# Patient Record
Sex: Female | Born: 1955
Health system: Southern US, Community
[De-identification: ages and names within clinical notes are randomized; demographics above are authoritative.]

## PROBLEM LIST (undated history)

## (undated) DIAGNOSIS — D841 Defects in the complement system: Secondary | ICD-10-CM

## (undated) DIAGNOSIS — H269 Unspecified cataract: Secondary | ICD-10-CM

## (undated) DIAGNOSIS — D649 Anemia, unspecified: Secondary | ICD-10-CM

## (undated) DIAGNOSIS — Z8719 Personal history of other diseases of the digestive system: Secondary | ICD-10-CM

## (undated) DIAGNOSIS — L509 Urticaria, unspecified: Secondary | ICD-10-CM

## (undated) DIAGNOSIS — M8580 Other specified disorders of bone density and structure, unspecified site: Secondary | ICD-10-CM

## (undated) DIAGNOSIS — K219 Gastro-esophageal reflux disease without esophagitis: Secondary | ICD-10-CM

## (undated) DIAGNOSIS — T7840XA Allergy, unspecified, initial encounter: Secondary | ICD-10-CM

## (undated) HISTORY — DX: Anemia, unspecified: D64.9

## (undated) HISTORY — DX: Defects in the complement system: D84.1

## (undated) HISTORY — DX: Personal history of other diseases of the digestive system: Z87.19

## (undated) HISTORY — DX: Unspecified cataract: H26.9

## (undated) HISTORY — DX: Allergy, unspecified, initial encounter: T78.40XA

## (undated) HISTORY — DX: Gastro-esophageal reflux disease without esophagitis: K21.9

## (undated) HISTORY — PX: EXPLORATORY LAPAROTOMY: SUR591

## (undated) HISTORY — DX: Urticaria, unspecified: L50.9

## (undated) HISTORY — DX: Other specified disorders of bone density and structure, unspecified site: M85.80

## (undated) HISTORY — PX: UPPER GASTROINTESTINAL ENDOSCOPY: SHX188

---

## 1998-08-21 ENCOUNTER — Ambulatory Visit (HOSPITAL_COMMUNITY): Admission: RE | Admit: 1998-08-21 | Discharge: 1998-08-21 | Payer: Self-pay | Admitting: Internal Medicine

## 1998-08-21 ENCOUNTER — Encounter: Payer: Self-pay | Admitting: Internal Medicine

## 1998-09-21 ENCOUNTER — Ambulatory Visit (HOSPITAL_COMMUNITY): Admission: RE | Admit: 1998-09-21 | Discharge: 1998-09-21 | Payer: Self-pay | Admitting: Internal Medicine

## 1998-09-21 ENCOUNTER — Encounter: Payer: Self-pay | Admitting: Internal Medicine

## 1998-12-24 ENCOUNTER — Other Ambulatory Visit: Admission: RE | Admit: 1998-12-24 | Discharge: 1998-12-24 | Payer: Self-pay | Admitting: Gynecology

## 1998-12-29 ENCOUNTER — Ambulatory Visit (HOSPITAL_COMMUNITY): Admission: RE | Admit: 1998-12-29 | Discharge: 1998-12-29 | Payer: Self-pay | Admitting: Internal Medicine

## 1999-01-15 ENCOUNTER — Inpatient Hospital Stay (HOSPITAL_COMMUNITY): Admission: EM | Admit: 1999-01-15 | Discharge: 1999-01-16 | Payer: Self-pay | Admitting: Emergency Medicine

## 1999-01-15 ENCOUNTER — Encounter: Payer: Self-pay | Admitting: Internal Medicine

## 1999-01-16 ENCOUNTER — Encounter: Payer: Self-pay | Admitting: Internal Medicine

## 1999-01-17 ENCOUNTER — Inpatient Hospital Stay (HOSPITAL_COMMUNITY): Admission: AD | Admit: 1999-01-17 | Discharge: 1999-01-20 | Payer: Self-pay | Admitting: Internal Medicine

## 1999-01-17 ENCOUNTER — Encounter: Payer: Self-pay | Admitting: Internal Medicine

## 1999-01-18 ENCOUNTER — Encounter: Payer: Self-pay | Admitting: Internal Medicine

## 1999-01-19 ENCOUNTER — Encounter: Payer: Self-pay | Admitting: Internal Medicine

## 1999-01-20 ENCOUNTER — Encounter: Payer: Self-pay | Admitting: Internal Medicine

## 1999-01-25 ENCOUNTER — Encounter: Payer: Self-pay | Admitting: Internal Medicine

## 1999-01-25 ENCOUNTER — Ambulatory Visit (HOSPITAL_COMMUNITY): Admission: RE | Admit: 1999-01-25 | Discharge: 1999-01-25 | Payer: Self-pay | Admitting: Internal Medicine

## 1999-03-06 ENCOUNTER — Inpatient Hospital Stay (HOSPITAL_COMMUNITY): Admission: RE | Admit: 1999-03-06 | Discharge: 1999-03-08 | Payer: Self-pay | Admitting: Surgery

## 1999-05-11 ENCOUNTER — Encounter: Admission: RE | Admit: 1999-05-11 | Discharge: 1999-05-11 | Payer: Self-pay | Admitting: Internal Medicine

## 1999-05-11 ENCOUNTER — Encounter: Payer: Self-pay | Admitting: Internal Medicine

## 1999-12-30 ENCOUNTER — Other Ambulatory Visit: Admission: RE | Admit: 1999-12-30 | Discharge: 1999-12-30 | Payer: Self-pay | Admitting: Obstetrics and Gynecology

## 2000-01-12 ENCOUNTER — Encounter: Payer: Self-pay | Admitting: Internal Medicine

## 2000-01-12 ENCOUNTER — Ambulatory Visit (HOSPITAL_COMMUNITY): Admission: RE | Admit: 2000-01-12 | Discharge: 2000-01-12 | Payer: Self-pay | Admitting: Internal Medicine

## 2000-04-06 ENCOUNTER — Other Ambulatory Visit: Admission: RE | Admit: 2000-04-06 | Discharge: 2000-04-06 | Payer: Self-pay | Admitting: Obstetrics and Gynecology

## 2000-04-24 ENCOUNTER — Encounter (INDEPENDENT_AMBULATORY_CARE_PROVIDER_SITE_OTHER): Payer: Self-pay | Admitting: Specialist

## 2000-04-24 ENCOUNTER — Other Ambulatory Visit: Admission: RE | Admit: 2000-04-24 | Discharge: 2000-04-24 | Payer: Self-pay | Admitting: Obstetrics and Gynecology

## 2000-06-07 ENCOUNTER — Encounter: Admission: RE | Admit: 2000-06-07 | Discharge: 2000-06-07 | Payer: Self-pay | Admitting: Internal Medicine

## 2000-06-07 ENCOUNTER — Encounter: Payer: Self-pay | Admitting: Internal Medicine

## 2000-07-19 ENCOUNTER — Other Ambulatory Visit: Admission: RE | Admit: 2000-07-19 | Discharge: 2000-07-19 | Payer: Self-pay | Admitting: Obstetrics and Gynecology

## 2000-07-19 ENCOUNTER — Encounter (INDEPENDENT_AMBULATORY_CARE_PROVIDER_SITE_OTHER): Payer: Self-pay

## 2001-06-08 ENCOUNTER — Encounter: Payer: Self-pay | Admitting: Internal Medicine

## 2001-06-08 ENCOUNTER — Encounter: Admission: RE | Admit: 2001-06-08 | Discharge: 2001-06-08 | Payer: Self-pay | Admitting: Gastroenterology

## 2002-06-10 ENCOUNTER — Encounter: Payer: Self-pay | Admitting: Internal Medicine

## 2002-06-10 ENCOUNTER — Encounter: Admission: RE | Admit: 2002-06-10 | Discharge: 2002-06-10 | Payer: Self-pay | Admitting: Internal Medicine

## 2002-09-20 ENCOUNTER — Encounter: Admission: RE | Admit: 2002-09-20 | Discharge: 2002-09-20 | Payer: Self-pay | Admitting: Sports Medicine

## 2002-11-13 ENCOUNTER — Ambulatory Visit (HOSPITAL_COMMUNITY): Admission: EM | Admit: 2002-11-13 | Discharge: 2002-11-14 | Payer: Self-pay | Admitting: Internal Medicine

## 2002-11-14 ENCOUNTER — Encounter: Payer: Self-pay | Admitting: Internal Medicine

## 2002-12-24 ENCOUNTER — Ambulatory Visit (HOSPITAL_COMMUNITY): Admission: RE | Admit: 2002-12-24 | Discharge: 2002-12-24 | Payer: Self-pay | Admitting: Internal Medicine

## 2002-12-24 ENCOUNTER — Encounter: Payer: Self-pay | Admitting: Internal Medicine

## 2003-06-17 ENCOUNTER — Encounter: Admission: RE | Admit: 2003-06-17 | Discharge: 2003-06-17 | Payer: Self-pay | Admitting: Internal Medicine

## 2004-04-26 ENCOUNTER — Other Ambulatory Visit: Admission: RE | Admit: 2004-04-26 | Discharge: 2004-04-26 | Payer: Self-pay | Admitting: Obstetrics and Gynecology

## 2004-06-28 ENCOUNTER — Encounter: Admission: RE | Admit: 2004-06-28 | Discharge: 2004-06-28 | Payer: Self-pay | Admitting: Internal Medicine

## 2004-07-09 ENCOUNTER — Encounter: Admission: RE | Admit: 2004-07-09 | Discharge: 2004-07-09 | Payer: Self-pay

## 2004-10-29 ENCOUNTER — Other Ambulatory Visit: Admission: RE | Admit: 2004-10-29 | Discharge: 2004-10-29 | Payer: Self-pay | Admitting: Obstetrics and Gynecology

## 2005-04-28 ENCOUNTER — Other Ambulatory Visit: Admission: RE | Admit: 2005-04-28 | Discharge: 2005-04-28 | Payer: Self-pay | Admitting: Obstetrics and Gynecology

## 2005-06-20 ENCOUNTER — Encounter: Admission: RE | Admit: 2005-06-20 | Discharge: 2005-06-20 | Payer: Self-pay | Admitting: Internal Medicine

## 2006-04-19 ENCOUNTER — Other Ambulatory Visit: Admission: RE | Admit: 2006-04-19 | Discharge: 2006-04-19 | Payer: Self-pay | Admitting: Obstetrics and Gynecology

## 2006-06-29 ENCOUNTER — Encounter: Admission: RE | Admit: 2006-06-29 | Discharge: 2006-06-29 | Payer: Self-pay | Admitting: Obstetrics and Gynecology

## 2006-11-24 ENCOUNTER — Ambulatory Visit: Payer: Self-pay | Admitting: Sports Medicine

## 2006-11-24 DIAGNOSIS — M79609 Pain in unspecified limb: Secondary | ICD-10-CM | POA: Insufficient documentation

## 2006-11-24 DIAGNOSIS — G8929 Other chronic pain: Secondary | ICD-10-CM | POA: Insufficient documentation

## 2007-02-09 ENCOUNTER — Ambulatory Visit: Payer: Self-pay | Admitting: Sports Medicine

## 2007-02-09 DIAGNOSIS — M722 Plantar fascial fibromatosis: Secondary | ICD-10-CM | POA: Insufficient documentation

## 2007-02-09 DIAGNOSIS — M2170 Unequal limb length (acquired), unspecified site: Secondary | ICD-10-CM | POA: Insufficient documentation

## 2007-04-25 ENCOUNTER — Other Ambulatory Visit: Admission: RE | Admit: 2007-04-25 | Discharge: 2007-04-25 | Payer: Self-pay | Admitting: Obstetrics and Gynecology

## 2007-04-27 ENCOUNTER — Encounter: Admission: RE | Admit: 2007-04-27 | Discharge: 2007-04-27 | Payer: Self-pay | Admitting: Obstetrics and Gynecology

## 2007-05-31 HISTORY — PX: COLONOSCOPY: SHX174

## 2007-06-30 ENCOUNTER — Ambulatory Visit: Payer: Self-pay | Admitting: Family Medicine

## 2007-08-10 ENCOUNTER — Ambulatory Visit: Payer: Self-pay | Admitting: Sports Medicine

## 2007-08-10 DIAGNOSIS — M2020 Hallux rigidus, unspecified foot: Secondary | ICD-10-CM | POA: Insufficient documentation

## 2007-08-10 DIAGNOSIS — M2140 Flat foot [pes planus] (acquired), unspecified foot: Secondary | ICD-10-CM | POA: Insufficient documentation

## 2007-09-21 ENCOUNTER — Ambulatory Visit: Payer: Self-pay | Admitting: Internal Medicine

## 2007-09-21 DIAGNOSIS — F430 Acute stress reaction: Secondary | ICD-10-CM | POA: Insufficient documentation

## 2007-09-21 DIAGNOSIS — R109 Unspecified abdominal pain: Secondary | ICD-10-CM | POA: Insufficient documentation

## 2007-09-23 DIAGNOSIS — K219 Gastro-esophageal reflux disease without esophagitis: Secondary | ICD-10-CM | POA: Insufficient documentation

## 2007-09-24 ENCOUNTER — Telehealth (INDEPENDENT_AMBULATORY_CARE_PROVIDER_SITE_OTHER): Payer: Self-pay | Admitting: *Deleted

## 2007-11-10 ENCOUNTER — Encounter: Payer: Self-pay | Admitting: Internal Medicine

## 2007-11-16 ENCOUNTER — Ambulatory Visit: Payer: Self-pay | Admitting: Internal Medicine

## 2007-11-29 ENCOUNTER — Ambulatory Visit: Payer: Self-pay | Admitting: Internal Medicine

## 2007-11-29 ENCOUNTER — Encounter: Payer: Self-pay | Admitting: Internal Medicine

## 2007-12-04 ENCOUNTER — Encounter: Payer: Self-pay | Admitting: Internal Medicine

## 2008-01-24 ENCOUNTER — Ambulatory Visit: Payer: Self-pay | Admitting: Internal Medicine

## 2008-01-24 DIAGNOSIS — J069 Acute upper respiratory infection, unspecified: Secondary | ICD-10-CM | POA: Insufficient documentation

## 2008-01-24 DIAGNOSIS — H8309 Labyrinthitis, unspecified ear: Secondary | ICD-10-CM | POA: Insufficient documentation

## 2008-04-22 ENCOUNTER — Ambulatory Visit: Payer: Self-pay | Admitting: Internal Medicine

## 2008-04-22 DIAGNOSIS — E538 Deficiency of other specified B group vitamins: Secondary | ICD-10-CM | POA: Insufficient documentation

## 2008-04-22 DIAGNOSIS — E559 Vitamin D deficiency, unspecified: Secondary | ICD-10-CM | POA: Insufficient documentation

## 2008-04-22 DIAGNOSIS — E876 Hypokalemia: Secondary | ICD-10-CM | POA: Insufficient documentation

## 2008-04-22 DIAGNOSIS — R252 Cramp and spasm: Secondary | ICD-10-CM | POA: Insufficient documentation

## 2008-04-23 LAB — CONVERTED CEMR LAB
BUN: 10 mg/dL (ref 6–23)
Basophils Absolute: 0 10*3/uL (ref 0.0–0.1)
Basophils Relative: 0.6 % (ref 0.0–3.0)
Calcium: 8.6 mg/dL (ref 8.4–10.5)
Chloride: 106 meq/L (ref 96–112)
Creatinine, Ser: 0.6 mg/dL (ref 0.4–1.2)
Eosinophils Absolute: 0.4 10*3/uL (ref 0.0–0.7)
GFR calc Af Amer: 136 mL/min
GFR calc non Af Amer: 112 mL/min
HCT: 36 % (ref 36.0–46.0)
Hemoglobin, Urine: NEGATIVE
Ketones, ur: NEGATIVE mg/dL
MCHC: 34.7 g/dL (ref 30.0–36.0)
MCV: 91.5 fL (ref 78.0–100.0)
Monocytes Absolute: 0.4 10*3/uL (ref 0.1–1.0)
Neutrophils Relative %: 61.9 % (ref 43.0–77.0)
RBC: 3.94 M/uL (ref 3.87–5.11)
Total CK: 63 units/L (ref 7–177)
Total Protein, Urine: NEGATIVE mg/dL
Urine Glucose: NEGATIVE mg/dL
Urobilinogen, UA: 0.2 (ref 0.0–1.0)
Vitamin B-12: 171 pg/mL — ABNORMAL LOW (ref 211–911)

## 2008-04-24 ENCOUNTER — Encounter: Payer: Self-pay | Admitting: Internal Medicine

## 2008-04-24 ENCOUNTER — Telehealth: Payer: Self-pay | Admitting: Internal Medicine

## 2008-04-24 ENCOUNTER — Ambulatory Visit: Payer: Self-pay | Admitting: Internal Medicine

## 2008-04-24 ENCOUNTER — Observation Stay (HOSPITAL_COMMUNITY): Admission: EM | Admit: 2008-04-24 | Discharge: 2008-04-25 | Payer: Self-pay | Admitting: Emergency Medicine

## 2008-04-28 ENCOUNTER — Other Ambulatory Visit: Admission: RE | Admit: 2008-04-28 | Discharge: 2008-04-28 | Payer: Self-pay | Admitting: Obstetrics and Gynecology

## 2008-04-28 LAB — CONVERTED CEMR LAB
Calcium, Total (PTH): 9.1 mg/dL (ref 8.4–10.5)
PTH: 87.5 pg/mL — ABNORMAL HIGH (ref 14.0–72.0)
Vit D, 1,25-Dihydroxy: 22 — ABNORMAL LOW (ref 30–89)

## 2008-07-25 ENCOUNTER — Encounter: Admission: RE | Admit: 2008-07-25 | Discharge: 2008-07-25 | Payer: Self-pay | Admitting: Internal Medicine

## 2008-08-20 ENCOUNTER — Ambulatory Visit: Payer: Self-pay | Admitting: Internal Medicine

## 2008-08-20 DIAGNOSIS — R1310 Dysphagia, unspecified: Secondary | ICD-10-CM | POA: Insufficient documentation

## 2008-09-01 ENCOUNTER — Ambulatory Visit: Payer: Self-pay | Admitting: Internal Medicine

## 2008-09-03 LAB — CONVERTED CEMR LAB
Calcium: 9 mg/dL (ref 8.4–10.5)
Creatinine, Ser: 0.7 mg/dL (ref 0.4–1.2)
GFR calc non Af Amer: 93.74 mL/min (ref >60)
Magnesium: 2 mg/dL (ref 1.5–2.5)
Sodium: 142 meq/L (ref 135–145)
Vitamin B-12: 234 pg/mL (ref 211–911)

## 2008-09-05 ENCOUNTER — Ambulatory Visit: Payer: Self-pay | Admitting: Internal Medicine

## 2009-04-30 ENCOUNTER — Other Ambulatory Visit: Admission: RE | Admit: 2009-04-30 | Discharge: 2009-04-30 | Payer: Self-pay | Admitting: Obstetrics and Gynecology

## 2009-06-01 ENCOUNTER — Ambulatory Visit: Payer: Self-pay | Admitting: Internal Medicine

## 2009-06-01 LAB — CONVERTED CEMR LAB
BUN: 12 mg/dL (ref 6–23)
Chloride: 105 meq/L (ref 96–112)
GFR calc non Af Amer: 93.46 mL/min (ref >60)
Potassium: 4 meq/L (ref 3.5–5.1)
Sodium: 137 meq/L (ref 135–145)
Vit D, 25-Hydroxy: 51 ng/mL (ref 30–89)
Vitamin B-12: 500 pg/mL (ref 211–911)

## 2009-06-12 ENCOUNTER — Encounter: Payer: Self-pay | Admitting: Internal Medicine

## 2009-07-27 ENCOUNTER — Ambulatory Visit: Payer: Self-pay | Admitting: Internal Medicine

## 2009-07-28 LAB — CONVERTED CEMR LAB
ALT: 22 units/L (ref 0–35)
AST: 21 units/L (ref 0–37)
BUN: 8 mg/dL (ref 6–23)
Basophils Relative: 0.5 % (ref 0.0–3.0)
Bilirubin, Direct: 0.1 mg/dL (ref 0.0–0.3)
Chloride: 106 meq/L (ref 96–112)
Eosinophils Relative: 5.9 % — ABNORMAL HIGH (ref 0.0–5.0)
GFR calc non Af Amer: 93.41 mL/min (ref >60)
HCT: 36.4 % (ref 36.0–46.0)
LDL Cholesterol: 85 mg/dL (ref 0–99)
Leukocytes, UA: NEGATIVE
MCV: 93.3 fL (ref 78.0–100.0)
Monocytes Relative: 6.4 % (ref 3.0–12.0)
Neutrophils Relative %: 57.4 % (ref 43.0–77.0)
Nitrite: NEGATIVE
Platelets: 155 10*3/uL (ref 150.0–400.0)
Potassium: 3.8 meq/L (ref 3.5–5.1)
RBC: 3.91 M/uL (ref 3.87–5.11)
Specific Gravity, Urine: 1.01 (ref 1.000–1.030)
Total Bilirubin: 0.6 mg/dL (ref 0.3–1.2)
Total CHOL/HDL Ratio: 2
Total Protein: 6.6 g/dL (ref 6.0–8.3)
Urobilinogen, UA: 0.2 (ref 0.0–1.0)
VLDL: 11 mg/dL (ref 0.0–40.0)
WBC: 4.9 10*3/uL (ref 4.5–10.5)

## 2009-07-31 ENCOUNTER — Ambulatory Visit: Payer: Self-pay | Admitting: Internal Medicine

## 2010-07-01 NOTE — Assessment & Plan Note (Signed)
Summary: 6 MO FU/$50/PN0--rs'd/bumped/cd   Vital Signs:  Patient profile:   55 year old female Weight:      139 pounds BMI:     23.95 Temp:     98.8 degrees F oral Pulse rate:   70 / minute BP sitting:   122 / 60  (left arm)  Vitals Entered By: Tora Perches (July 31, 2009 2:57 PM) CC: f/u Is Patient Diabetic? No   Primary Care Provider:  Janelle Floor MD  CC:  f/u.  History of Present Illness: The patient presents for a wellness examination   Preventive Screening-Counseling & Management  Alcohol-Tobacco     Smoking Status: never  Current Medications (verified): 1)  Kariva 0.15-0.02/0.01 Mg (21/5)  Tabs (Desogestrel-Ethinyl Estradiol) .[name].. 1 By Mouth Qd 2)  Vitamin D3 1000 Unit  Tabs (Cholecalciferol) .... 2 By Mouth Daily 3)  Omeprazole 20 Mg Cpdr (Omeprazole) .... One By Mouth Daily 4)  Epipen 2-Pak 0.3 Mg/0.30ml (1:1000)  Devi (Epinephrine Hcl (Anaphylaxis)) .... As Dirrect. 5)  Cobal-1000 1000 Mcg/ml Soln (Cyanocobalamin) .[name].. 1 Ml Q 2 Wks Subcutaneously or Im 6)  Bd Integra Syringe 25g X 5/8" 3 Ml Misc (Syringe/needle (Disp)) .... Q 2 Wks 7)  Align  Caps (Misc Intestinal Flora Regulat) .[name].. 1 By Mouth Qd  Allergies: 1)  ! Penicillin V Potassium (Penicillin V Potassium)  Past History:  Past Medical History: Last updated: 04/24/2008 Has h/o right knee pain in past Intest. edema due to allergy to fish H/o hives - cold exposure related GERD Hx of intertroception with partial obstruction Hereditary angioedema (high IgE levels)-  Past Surgical History: Last updated: 09/21/2007 Laparotomy-exploratory  Family History: Last updated: 04/24/2008 Parkinson's - Mother Daughter with hives  and hereditary angioedema (Seen by immunologist at [facility]) Father - hives  Social History: Last updated: 07/31/2009 Occupation: Research at [employer] Never Smoked Alcohol use-no Regular exercise-yes - yoga  Social History: Occupation: Producer, television/film/video at [employer] Never  Smoked Alcohol use-no Regular exercise-yes - yoga  Review of Systems  The patient denies anorexia, fever, weight loss, weight gain, vision loss, decreased hearing, hoarseness, chest pain, syncope, dyspnea on exertion, peripheral edema, prolonged cough, headaches, hemoptysis, abdominal pain, melena, hematochezia, severe indigestion/heartburn, hematuria, incontinence, genital sores, muscle weakness, suspicious skin lesions, transient blindness, difficulty walking, depression, unusual weight change, abnormal bleeding, enlarged lymph nodes, angioedema, and breast masses.    Physical Exam  General:  NAD Head:  Normocephalic and atraumatic without obvious abnormalities. No apparent alopecia or balding. Eyes:  vision grossly intact, pupils equal, pupils round, and pupils reactive to light.   Ears:  External ear exam shows no significant lesions or deformities.  Otoscopic examination reveals clear canals, tympanic membranes are intact bilaterally without bulging, retraction, inflammation or discharge. Hearing is grossly normal bilaterally. Nose:  External nasal examination shows no deformity or inflammation. Nasal mucosa are pink and moist without lesions or exudates. Mouth:  Oral mucosa and oropharynx exam w/ a speculum- without lesions or exudates.  Teeth in good repair. Neck:  No deformities, masses, or tenderness noted. Lungs:  Normal respiratory effort, chest expands symmetrically. Lungs are clear to auscultation, no crackles or wheezes. Heart:  Normal rate and regular rhythm. S1 and S2 normal without gallop, murmur, click, rub or other extra sounds. Abdomen:  Bowel sounds positive,abdomen soft and non-tender without masses, organomegaly or hernias noted. Msk:  No deformity or scoliosis noted of thoracic or lumbar spine.   Extremities:  No clubbing, cyanosis, edema, or deformity noted with normal  full range of motion of all joints.   Neurologic:  No cranial nerve deficits noted. Station and gait  are normal. Plantar reflexes are down-going bilaterally. DTRs are symmetrical throughout. Sensory, motor and coordinative functions appear intact. Skin:  Intact without suspicious lesions or rashes Cervical Nodes:  No lymphadenopathy noted Inguinal Nodes:  No significant adenopathy Psych:  Cognition and judgment appear intact. Alert and cooperative with normal attention span and concentration. No apparent delusions, illusions, hallucinations   Impression & Recommendations:  Problem # 1:  WELL ADULT EXAM (ICD-V70.0) Assessment New Health and age related issues were discussed. Available screening tests and vaccinations were discussed as well. Healthy life style including good diet and execise was discussed. The labs were reviewed with the patient. Colon up to date. GYN q 12 months   Problem # 2:  B12 DEFICIENCY (ICD-266.2) Assessment: Improved On prescription therapy   Problem # 3:  CRAMPS,LEG (ICD-729.82) Assessment: Unchanged Stretch  Problem # 4:  GERD (ICD-530.81) Assessment: Comment Only  Her updated medication list for this problem includes:    Omeprazole 20 Mg Cpdr (Omeprazole) ..... One by mouth daily  Complete Medication List: 1)  Kariva 0.15-0.02/0.01 Mg (21/5) Tabs (Desogestrel-ethinyl estradiol) .[name].. 1 by mouth qd 2)  Vitamin D3 1000 Unit Tabs (Cholecalciferol) .... 2 by mouth daily 3)  Omeprazole 20 Mg Cpdr (Omeprazole) .... One by mouth daily 4)  Epipen 2-pak 0.3 Mg/0.18ml (1:1000) Devi (Epinephrine hcl (anaphylaxis)) .... As dirrect. 5)  Cobal-1000 1000 Mcg/ml Soln (Cyanocobalamin) .[name].. 1 ml q 2 wks subcutaneously or im 6)  Bd Integra Syringe 25g X 5/8" 3 Ml Misc (Syringe/needle (disp)) .... Q 2 wks 7)  Tylenol Extra Strength 500 Mg Tabs (Acetaminophen) .... 2 by mouth two times a day prn 8)  Ibuprofen 200 Mg Tabs (Ibuprofen) .... 2 by mouth three times a day as needed pc 9)  Diphenhist 25 Mg Tabs (Diphenhydramine hcl) .[name].. 1-2 by mouth two times a day prn 10)  Pearls  Ic Probiotic  .[name].. 1 by mouth qd 11)  Sudafed 12 Hour 120 Mg Xr12h-tab (Pseudoephedrine hcl) .[name].. 1 by mouth two times a day prn  Patient Instructions: 1)  Please schedule a follow-up appointment in 1 year well w/labs with B12 and Vit D 268.9  266.20 v 70.0. Prescriptions: KARIVA 0.15-0.02/0.01 MG (21/5)  TABS (DESOGESTREL-ETHINYL ESTRADIOL) 1 by mouth qd Brand medically necessary #3 x 3   Entered and Authorized by:   Tresa Garter MD   Signed by:   Tresa Garter MD on 07/31/2009   Method used:   Print then Give to Patient   RxID:   [id number] SUDAFED 12 HOUR 120 MG XR12H-TAB (PSEUDOEPHEDRINE HCL) 1 by mouth two times a day prn  #60 x 3   Entered and Authorized by:   Tresa Garter MD   Signed by:   Tresa Garter MD on 07/31/2009   Method used:   Print then Give to Patient   RxID:   [id number] PEARLS IC PROBIOTIC 1 by mouth qd  #100 x 3   Entered and Authorized by:   Tresa Garter MD   Signed by:   Tresa Garter MD on 07/31/2009   Method used:   Print then Give to Patient   RxID:   [id number] DIPHENHIST 25 MG TABS (DIPHENHYDRAMINE HCL) 1-2 by mouth two times a day prn  #200 x 6   Entered and Authorized by:   Tresa Garter MD   Signed by:  Georgina Quint Ramar Nobrega MD on 07/31/2009   Method used:   Print then Give to Patient   RxID:   [id number] IBUPROFEN 200 MG TABS (IBUPROFEN) 2 by mouth three times a day as needed pc  #200 x 6   Entered and Authorized by:   Tresa Garter MD   Signed by:   Tresa Garter MD on 07/31/2009   Method used:   Print then Give to Patient   RxID:   [id number] TYLENOL EXTRA STRENGTH 500 MG TABS (ACETAMINOPHEN) 2 by mouth two times a day prn  #200 x 3   Entered and Authorized by:   Tresa Garter MD   Signed by:   Tresa Garter MD on 07/31/2009   Method used:   Print then Give to Patient   RxID:   [id number] BD INTEGRA SYRINGE 25G X 5/8" 3 ML MISC (SYRINGE/NEEDLE  (DISP)) q 2 wks  #20 x 12   Entered and Authorized by:   Tresa Garter MD   Signed by:   Tresa Garter MD on 07/31/2009   Method used:   Print then Give to Patient   RxID:   [id number] COBAL-1000 1000 MCG/ML SOLN (CYANOCOBALAMIN) 1 ml q 2 wks subcutaneously or im  #10 ml x 12   Entered and Authorized by:   Tresa Garter MD   Signed by:   Tresa Garter MD on 07/31/2009   Method used:   Print then Give to Patient   RxID:   [id number] OMEPRAZOLE 20 MG CPDR (OMEPRAZOLE) one by mouth daily  #90 x 3   Entered and Authorized by:   Tresa Garter MD   Signed by:   Tresa Garter MD on 07/31/2009   Method used:   Print then Give to Patient   RxID:   [id number] VITAMIN D3 1000 UNIT  TABS (CHOLECALCIFEROL) 2 by mouth daily  #200 x 3   Entered and Authorized by:   Tresa Garter MD   Signed by:   Tresa Garter MD on 07/31/2009   Method used:   Print then Give to Patient   RxID:   [id number]

## 2010-07-01 NOTE — Letter (Signed)
Summary: [facility] [employer]  [facility] [employer]   Imported By: Lennie Odor 07/02/2009 10:20:55  _____________________________________________________________________  External Attachment:    Type:   Image     Comment:   External Document

## 2010-07-27 ENCOUNTER — Other Ambulatory Visit: Payer: Self-pay

## 2010-07-30 ENCOUNTER — Other Ambulatory Visit: Payer: Self-pay

## 2010-08-09 ENCOUNTER — Encounter: Payer: Self-pay | Admitting: Internal Medicine

## 2010-08-27 ENCOUNTER — Telehealth: Payer: Self-pay | Admitting: *Deleted

## 2010-08-27 DIAGNOSIS — E538 Deficiency of other specified B group vitamins: Secondary | ICD-10-CM

## 2010-08-27 DIAGNOSIS — Z0000 Encounter for general adult medical examination without abnormal findings: Secondary | ICD-10-CM

## 2010-08-27 NOTE — Telephone Encounter (Signed)
ok 

## 2010-08-27 NOTE — Telephone Encounter (Signed)
Message copied by Lanier Prude on Fri Aug 27, 2010  3:24 PM ------      Message from: [name]      Created: Wed Aug 25, 2010 12:07 PM      Regarding: cpx labs       Patient will need to be scheduled for cpx labs. Appt 10/08/10 at 3pm.            Thanks

## 2010-08-27 NOTE — Telephone Encounter (Signed)
Please put orders in for physical labs.

## 2010-08-30 NOTE — Telephone Encounter (Signed)
Called pt to inform...the patient was not available. Spoke to a female who did not speak english.

## 2010-09-23 ENCOUNTER — Telehealth: Payer: Self-pay | Admitting: Internal Medicine

## 2010-09-23 DIAGNOSIS — M545 Low back pain, unspecified: Secondary | ICD-10-CM

## 2010-09-24 NOTE — Telephone Encounter (Signed)
C/o bad LBP - needs PT again Ref done

## 2010-10-04 ENCOUNTER — Other Ambulatory Visit (INDEPENDENT_AMBULATORY_CARE_PROVIDER_SITE_OTHER): Payer: BC Managed Care – PPO

## 2010-10-04 DIAGNOSIS — E538 Deficiency of other specified B group vitamins: Secondary | ICD-10-CM

## 2010-10-04 DIAGNOSIS — Z0000 Encounter for general adult medical examination without abnormal findings: Secondary | ICD-10-CM

## 2010-10-04 LAB — CBC WITH DIFFERENTIAL/PLATELET
Basophils Absolute: 0 10*3/uL (ref 0.0–0.1)
Eosinophils Absolute: 0.3 10*3/uL (ref 0.0–0.7)
Hemoglobin: 13.3 g/dL (ref 12.0–15.0)
Lymphocytes Relative: 20.7 % (ref 12.0–46.0)
MCHC: 34.9 g/dL (ref 30.0–36.0)
Neutro Abs: 4.9 10*3/uL (ref 1.4–7.7)
Platelets: 167 10*3/uL (ref 150.0–400.0)
RDW: 12.5 % (ref 11.5–14.6)

## 2010-10-04 LAB — COMPREHENSIVE METABOLIC PANEL
Albumin: 3.6 g/dL (ref 3.5–5.2)
CO2: 28 mEq/L (ref 19–32)
GFR: 97.8 mL/min (ref >60.00)
Glucose, Bld: 79 mg/dL (ref 70–99)
Potassium: 4.2 mEq/L (ref 3.5–5.1)
Sodium: 137 mEq/L (ref 135–145)
Total Bilirubin: 0.6 mg/dL (ref 0.3–1.2)
Total Protein: 6.1 g/dL (ref 6.0–8.3)

## 2010-10-04 LAB — URINALYSIS
Bilirubin Urine: NEGATIVE
Hgb urine dipstick: NEGATIVE
Ketones, ur: NEGATIVE
Leukocytes, UA: NEGATIVE
Urobilinogen, UA: 1 (ref 0.0–1.0)
pH: 7.5 (ref 5.0–8.0)

## 2010-10-04 LAB — TSH: TSH: 3.52 u[IU]/mL (ref 0.35–5.50)

## 2010-10-04 LAB — LIPID PANEL: HDL: 78 mg/dL (ref >39.00)

## 2010-10-05 LAB — VITAMIN D 25 HYDROXY (VIT D DEFICIENCY, FRACTURES): Vit D, 25-Hydroxy: 52 ng/mL (ref 30–89)

## 2010-10-06 ENCOUNTER — Encounter: Payer: Self-pay | Admitting: Internal Medicine

## 2010-10-08 ENCOUNTER — Encounter: Payer: Self-pay | Admitting: Internal Medicine

## 2010-10-08 ENCOUNTER — Ambulatory Visit (INDEPENDENT_AMBULATORY_CARE_PROVIDER_SITE_OTHER): Payer: BC Managed Care – PPO | Admitting: Internal Medicine

## 2010-10-08 DIAGNOSIS — Z0000 Encounter for general adult medical examination without abnormal findings: Secondary | ICD-10-CM

## 2010-10-08 DIAGNOSIS — E538 Deficiency of other specified B group vitamins: Secondary | ICD-10-CM

## 2010-10-08 MED ORDER — ACETAMINOPHEN 500 MG PO TABS: 500.0000 mg | ORAL_TABLET | Freq: Four times a day (QID) | ORAL | Status: DC | PRN

## 2010-10-08 MED ORDER — OMEPRAZOLE 20 MG PO CPDR: 20.0000 mg | DELAYED_RELEASE_CAPSULE | Freq: Every day | ORAL | Status: DC

## 2010-10-08 MED ORDER — DIPHENHYDRAMINE HCL 25 MG PO TABS: 25.0000 mg | ORAL_TABLET | Freq: Three times a day (TID) | ORAL | Status: DC | PRN

## 2010-10-08 MED ORDER — ALIGN 4 MG PO CAPS: 1.0000 | ORAL_CAPSULE | Freq: Every day | ORAL | Status: DC

## 2010-10-08 MED ORDER — CYANOCOBALAMIN 1000 MCG/ML IJ SOLN: 1000.0000 ug | INTRAMUSCULAR | Status: DC

## 2010-10-08 MED ORDER — IBUPROFEN 200 MG PO TABS: 200.0000 mg | ORAL_TABLET | Freq: Two times a day (BID) | ORAL | Status: DC

## 2010-10-08 MED ORDER — PSEUDOEPHEDRINE HCL ER 120 MG PO TB12: 120.0000 mg | ORAL_TABLET | Freq: Two times a day (BID) | ORAL | Status: DC | PRN

## 2010-10-08 MED ORDER — ZOLPIDEM TARTRATE 5 MG PO TABS: 5.0000 mg | ORAL_TABLET | Freq: Every evening | ORAL | Status: DC | PRN

## 2010-10-08 NOTE — Assessment & Plan Note (Signed)
On Rx 

## 2010-10-08 NOTE — Progress Notes (Signed)
  Subjective:    Patient ID: [name], female    DOB: 1955/07/10, 55 y.o.   MRN: [id number]  HPI  The patient is here for a wellness exam. The patient has been doing well overall without major physical  issues going on lately. Her mom died - stressed out and can't sleep well.  Review of Systems  Constitutional: Positive for fatigue. Negative for fever and chills.  HENT: Negative for sneezing and tinnitus.   Eyes: Negative for redness and itching.  Respiratory: Negative for shortness of breath.   Cardiovascular: Negative for leg swelling.  Gastrointestinal: Negative for abdominal distention.  Genitourinary: Negative for dysuria.  Musculoskeletal: Negative for back pain.  Psychiatric/Behavioral: Positive for sleep disturbance. Negative for suicidal ideas, self-injury and dysphoric mood.       Objective:   Physical Exam  Constitutional: She appears well-developed and well-nourished. No distress.  HENT:  Head: Normocephalic.  Right Ear: External ear normal.  Left Ear: External ear normal.  Nose: Nose normal.  Mouth/Throat: Oropharynx is clear and moist.  Eyes: Conjunctivae are normal. Pupils are equal, round, and reactive to light. Right eye exhibits no discharge. Left eye exhibits no discharge.  Neck: Normal range of motion. Neck supple. No JVD present. No tracheal deviation present. No thyromegaly present.  Cardiovascular: Normal rate, regular rhythm and normal heart sounds.   Pulmonary/Chest: No stridor. No respiratory distress. She has no wheezes.  Abdominal: Soft. Bowel sounds are normal. She exhibits no distension and no mass. There is no tenderness. There is no rebound and no guarding.  Musculoskeletal: She exhibits no edema and no tenderness.  Lymphadenopathy:    She has no cervical adenopathy.  Neurological: She displays normal reflexes. No cranial nerve deficit. She exhibits normal muscle tone. Coordination normal.  Skin: No rash noted. No erythema.  Psychiatric:  She has a normal mood and affect. Her behavior is normal. Judgment and thought content normal.   Lab Results  Component Value Date   WBC 7.0 10/04/2010   HGB 13.3 10/04/2010   HCT 38.0 10/04/2010   PLT 167.0 10/04/2010   CHOL 200 10/04/2010   TRIG 65.0 10/04/2010   HDL 78.00 10/04/2010   ALT 21 10/04/2010   AST 18 10/04/2010   NA 137 10/04/2010   K 4.2 10/04/2010   CL 102 10/04/2010   CREATININE 0.7 10/04/2010   BUN 14 10/04/2010   CO2 28 10/04/2010   TSH 3.52 10/04/2010          Assessment & Plan:   Well adult exam We discussed age appropriate health related issues, including available/recomended screening tests and vaccinations. We discussed a need for adhering to healthy diet and exercise. Labs/EKG were reviewed/ordered. All questions were answered.  She has her GYN at [facility]  B12 DEFICIENCY On Rx

## 2010-10-08 NOTE — Assessment & Plan Note (Signed)
We discussed age appropriate health related issues, including available/recomended screening tests and vaccinations. We discussed a need for adhering to healthy diet and exercise. Labs/EKG were reviewed/ordered. All questions were answered.  She has her GYN at [facility]

## 2010-10-15 NOTE — Discharge Summary (Signed)
NAMEKRYSTL, [name]                         ACCOUNT NO.:  [id number]   MEDICAL RECORD NO.:  [id number]                   PATIENT TYPE:  INP   LOCATION:  6731                                 FACILITY:  [facility]   PHYSICIAN:  Rene Paci, M.D. [facility]          DATE OF BIRTH:  1956/05/02   DATE OF ADMISSION:  11/13/2002  DATE OF DISCHARGE:  11/14/2002                                 DISCHARGE SUMMARY   DISCHARGE DIAGNOSES:  1. Abdominal pain.  2. Nausea and vomiting.  3. Recurrent introsusception with low-grade obstruction.  4. Urinary tract infection.   BRIEF ADMISSION HISTORY:  Ms. Takacs is a 55 year old female who presents  with abdominal pain, nausea, and vomiting; onset was Saturday.  She states  it was worse on the evening of admission.  She states it started one hour  after she ate some brie.  There is a question of her having these symptoms  in the past related to a food allergy.   PAST MEDICAL HISTORY:  1. History of small-bowel obstruction.  2. History of introsusception.  3. History of abdominal surgery.   HOSPITAL COURSE:  Problem 1.  GI.  The patient presented with abdominal  pain, nausea, and vomiting, admitted to rule out recurrent small-bowel  obstruction versus a viral gastroenteritis versus food allergy.  She was  aggressively hydrated and made n.p.o.  She was treated with antiemetics and  Solu-Medrol for her possible food allergy.  A CT of the abdomen and pelvis  was ordered, and this revealed recurrent introsusception with low-grade  obstruction.  Symptomatically, the patient has improved; she is denying any  further abdominal pain.  She denies any further nausea or vomiting and is  currently tolerating clear liquids.  Our plan at this time is to advance her  to a regular diet, and if she tolerates this, she will be discharged home on  a steroid taper.  Problem 2.  ID.  Patient was afebrile but did have a mild leukocytosis with  a white count of 12,000.   Her urinalysis was consistent with a urinary tract  infection, and she has been started on Cipro to complete a full seven-day  course.  Problem 3.  Hypoglycemia.  Patient had one CBG of 171; this may be steroid  effect, as her admission serum glucose was 101.  This can be further  evaluated per her primary care physician.   LABORATORY DATA:  Labs at discharge, hemoglobin 13.4, white count 12.3.  CMET was normal except for a total bilirubin of 1.3.  Lipase, amylase, serum  pregnancy, and sed rate were all normal.  CT of the abdomen and pelvis  revealed recurrent introsusception with low-grade obstruction.   DISCHARGE MEDICATIONS:  1. Cipro 500 mg b.i.d. for seven days.  2.     Prednisone 20-mg taper.  3. Estrostep, as at home.   FOLLOW UP:  Follow up with Dr. Posey Rea.  Cornell Barman, P.A. [facility]                  Rene Paci, M.D. [facility]    LC/MEDQ  D:  11/14/2002  T:  11/15/2002  Job:  [id number]   cc:   Georgina Quint. Plotnikov, M.D. [facility]   Dr. Juanda Chance   Dr. Daphine Deutscher    cc:   Georgina Quint. Plotnikov, M.D. [facility]   Dr. Juanda Chance   Dr. Daphine Deutscher

## 2010-10-15 NOTE — H&P (Signed)
[name], [name]                         ACCOUNT NO.:  [id number]   MEDICAL RECORD NO.:  [id number]                   PATIENT TYPE:  INP   LOCATION:  6731                                 FACILITY:  [facility]   PHYSICIAN:  [name], M.D. [facility]      DATE OF BIRTH:  1956-03-20   DATE OF ADMISSION:  11/13/2002  DATE OF DISCHARGE:                                HISTORY & PHYSICAL   CHIEF COMPLAINT:  Abdominal pain, nausea, vomiting.   HISTORY OF PRESENT ILLNESS:  The patient is a 55 year old female who has  been sick since Saturday with the above symptoms.  The symptoms started one  hour after she ate a soft cheese brie, developed abdominal pain, bloating,  nausea with vomiting, took Vicodin, as she has done in the past, with some  relief of the symptoms, felt some better over Monday and Tuesday but has  been able to eat very little.  On Wednesday, she had gotten progressively  worse; her stomach was very bloated, she had severe abdominal cramps,  intractable nausea and vomiting, finally had to go to the hospital.   PAST MEDICAL HISTORY:  Small-bowel obstruction, status post abdominal  surgery a couple of years ago, which was possibly related to __________ food  allergy; the records are not available at present.   MEDICATIONS:  Estrostep.   ALLERGIES:  1. PENICILLIN.  2. Likely FISH.   SOCIAL HISTORY:  She is a nonsmoker, nondrinker.   FAMILY HISTORY:  A sister and daughter with IBS.   REVIEW OF SYSTEMS:  Her abdomen was bloated, as above.  There was no blood  in the stool; had a bowel movement 30 minutes prior to admission.  The  bloating has resolved, and she is feeling a little better.  No chills or  fever.  The rest is negative or as above.   PHYSICAL EXAMINATION:  VITAL SIGNS:  Blood pressure 123/64, heart rate 65,  temp 97.2.  GENERAL:  She is in mild acute distress, looks tired.  HEENT:  With dry mucosae.  NECK:  Supple.  No meningeal signs.  LUNGS:   Clear.  HEART:  With S1 and S2, slight tachycardia with activity.  ABDOMEN:  Soft, sensitive in the epigastric area, nontender.  No rebound  symptoms.  No mass is felt.  RECTAL EXAMINATION:  Not done.  SKIN:  Clear.  NEUROLOGIC:  She is alert, oriented, cooperative.   LABORATORY DATA:  None available (she was a direct admit).    ASSESSMENT AND PLAN:  1. Abdominal pain, recurrent, of unclear etiology.  Prior symptoms could     have been food allergy-related.  Obtain computed tomography of the     abdomen and pelvis, give intravenous morphine as needed.  2. Nausea and vomiting.  We will use Phenergan, nasogastric tube if needed.     May need to obtain gastrointestinal consult tomorrow.  3. Dehydration.  We will use intravenous fluids.  4.  Possible unexplained food allergy; we will use intravenous steroids     empirically.                                               [name], M.D. [facility]    AVP/MEDQ  D:  11/14/2002  T:  11/14/2002  Job:  [id number]   cc:   [name], M.D.  [address], [address]  [place]  [place] [address]  Fax: [phone]   [name], M.D. [facility]    cc:   [name], M.D.  [address]  [place]  [place] [address]  Fax: [phone]   [name], M.D. [facility]

## 2011-02-07 ENCOUNTER — Ambulatory Visit: Payer: BC Managed Care – PPO | Admitting: Sports Medicine

## 2011-02-22 ENCOUNTER — Ambulatory Visit (INDEPENDENT_AMBULATORY_CARE_PROVIDER_SITE_OTHER): Payer: BC Managed Care – PPO | Admitting: Sports Medicine

## 2011-02-22 VITALS — BP 110/62

## 2011-02-22 DIAGNOSIS — M545 Low back pain, unspecified: Secondary | ICD-10-CM

## 2011-02-22 NOTE — Progress Notes (Signed)
  Subjective:    Patient ID: [name], female    DOB: Mar 07, 1956, 55 y.o.   MRN: [id number]  HPI Low back pain started 1 year ago Left of center in lumbar area and spasm moves up the back PT and a pressure pad do improve the sxs Anytime she does yoga makes if flare  Walking now OK up to 6 miles Sitting is not bad  No radicular sxs No cough or sneeze pain  Warrior 1 position painful and lat bend painful   Review of Systems     Objective:   Physical Exam No acute distress  Excellent motion of the lumbar spine Excellent strength of hip abductors and other core muscles Pain is initiated with rotation or bending that affects the left quadratus lumborum muscle This vessel also is tender to deep palpation  Examination 4 discogenic problems are completely unremarkable Neurologic examination is normal       Assessment & Plan:

## 2011-02-22 NOTE — Patient Instructions (Signed)
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  Shoulder and low back rotation with weights held at arm length 2.  Lateral bend of low back to each side 3.  Forward rotated dips - left elbow to RT knee and vis versa -   For all - hold a dumbell in each hand Build up to 3 sets of 15 Push to where you  Feel light pain  In addition keep doing 2 to 3 of the stretches or PT exercises that seem to most affect this muscle  Try to do these daily if you can  Walking is good for this Some postures in yoga are OK  Recheck with me in 2 months

## 2011-02-23 DIAGNOSIS — M545 Low back pain, unspecified: Secondary | ICD-10-CM | POA: Insufficient documentation

## 2011-02-23 NOTE — Assessment & Plan Note (Signed)
Please see patient instructions for a series of rehabilitation exercises to begin  She is to work with these over the next few months  The emergency room physicians and other things that aggravate this  OK to do yoga but avoid the more extreme stretches  Recheck if not resolved in 3 months

## 2011-02-28 ENCOUNTER — Telehealth: Payer: Self-pay | Admitting: Internal Medicine

## 2011-02-28 MED ORDER — AMITRIPTYLINE HCL 10 MG PO TABS: 10.0000 mg | ORAL_TABLET | Freq: Every day | ORAL | Status: DC

## 2011-02-28 NOTE — Telephone Encounter (Signed)
Per neurology - needs Amitript to help her sleep. Will prescribe. Done

## 2011-03-01 LAB — CBC
HCT: 41.1
Hemoglobin: 14
MCHC: 34.1
MCV: 91.8
Platelets: 187
RBC: 4.47
RDW: 12.5
WBC: 5.8

## 2011-03-01 LAB — COMPREHENSIVE METABOLIC PANEL
Albumin: 3.4 — ABNORMAL LOW
Alkaline Phosphatase: 55
BUN: 8
Chloride: 103
Creatinine, Ser: 0.7
GFR calc non Af Amer: >60
Glucose, Bld: 118 — ABNORMAL HIGH
Potassium: 3.8
Total Bilirubin: 0.9

## 2011-03-01 LAB — BASIC METABOLIC PANEL
BUN: 4 — ABNORMAL LOW
Chloride: 109
Creatinine, Ser: 0.63
GFR calc non Af Amer: >60
Glucose, Bld: 130 — ABNORMAL HIGH

## 2011-03-01 LAB — DIFFERENTIAL
Basophils Absolute: 0
Basophils Relative: 0
Lymphocytes Relative: 10 — ABNORMAL LOW
Monocytes Absolute: 0.3
Neutro Abs: 4.8
Neutrophils Relative %: 84 — ABNORMAL HIGH

## 2011-03-01 LAB — LIPASE, BLOOD: Lipase: 22

## 2011-03-01 LAB — H. PYLORI ANTIBODY, IGG: H Pylori IgG: <0.4

## 2011-04-08 ENCOUNTER — Ambulatory Visit: Payer: BC Managed Care – PPO | Admitting: Internal Medicine

## 2011-04-08 DIAGNOSIS — Z0289 Encounter for other administrative examinations: Secondary | ICD-10-CM

## 2011-05-12 ENCOUNTER — Other Ambulatory Visit: Payer: Self-pay | Admitting: *Deleted

## 2011-05-12 MED ORDER — AMITRIPTYLINE HCL 10 MG PO TABS: 10.0000 mg | ORAL_TABLET | Freq: Every day | ORAL | Status: DC

## 2011-05-31 LAB — HM PAP SMEAR: HM Pap smear: NORMAL

## 2011-09-10 ENCOUNTER — Telehealth: Payer: Self-pay | Admitting: Internal Medicine

## 2011-09-10 MED ORDER — CYCLOBENZAPRINE HCL 5 MG PO TABS: 5.0000 mg | ORAL_TABLET | Freq: Two times a day (BID) | ORAL | Status: AC | PRN

## 2011-09-10 NOTE — Telephone Encounter (Signed)
Needs Flexeril Done

## 2011-10-14 ENCOUNTER — Other Ambulatory Visit: Payer: Self-pay | Admitting: Internal Medicine

## 2011-10-17 ENCOUNTER — Telehealth: Payer: Self-pay | Admitting: *Deleted

## 2011-10-17 NOTE — Telephone Encounter (Signed)
OK to fill this prescription with additional refills x2 Thank you!  

## 2011-10-17 NOTE — Telephone Encounter (Signed)
Requested Medications     zolpidem (AMBIEN) 5 MG tablet [Pharmacy Med Name: ZOLPIDEM 5MG  TABLETS]   TAKE 1 TABLET BY MOUTH AT BEDTIME AS NEEDED FOR SLEEP   Disp: 30 tablet R: 0 Start: 10/14/2011  Class: Normal   Requested on: 10/14/2011   Last refill: 12/11/2010

## 2011-10-18 MED ORDER — ZOLPIDEM TARTRATE 5 MG PO TABS: 5.0000 mg | ORAL_TABLET | Freq: Every evening | ORAL | Status: DC | PRN

## 2011-10-18 NOTE — Telephone Encounter (Signed)
Done

## 2012-01-15 ENCOUNTER — Telehealth: Payer: Self-pay | Admitting: Internal Medicine

## 2012-01-15 MED ORDER — AMITRIPTYLINE HCL 10 MG PO TABS: 10.0000 mg | ORAL_TABLET | Freq: Every day | ORAL | Status: DC

## 2012-01-15 NOTE — Telephone Encounter (Signed)
Needs refill Done-amitriptyline

## 2012-06-12 ENCOUNTER — Encounter: Payer: BC Managed Care – PPO | Admitting: Internal Medicine

## 2012-06-15 ENCOUNTER — Encounter: Payer: Self-pay | Admitting: Internal Medicine

## 2012-06-15 ENCOUNTER — Ambulatory Visit (INDEPENDENT_AMBULATORY_CARE_PROVIDER_SITE_OTHER): Payer: BC Managed Care – PPO | Admitting: Internal Medicine

## 2012-06-15 VITALS — BP 118/70 | HR 72 | Temp 99.1°F | Resp 16 | Ht 65.0 in | Wt 138.0 lb

## 2012-06-15 DIAGNOSIS — Z91018 Allergy to other foods: Secondary | ICD-10-CM

## 2012-06-15 DIAGNOSIS — E559 Vitamin D deficiency, unspecified: Secondary | ICD-10-CM

## 2012-06-15 DIAGNOSIS — Z0000 Encounter for general adult medical examination without abnormal findings: Secondary | ICD-10-CM

## 2012-06-15 DIAGNOSIS — Z136 Encounter for screening for cardiovascular disorders: Secondary | ICD-10-CM

## 2012-06-15 DIAGNOSIS — Z23 Encounter for immunization: Secondary | ICD-10-CM

## 2012-06-15 DIAGNOSIS — E538 Deficiency of other specified B group vitamins: Secondary | ICD-10-CM

## 2012-06-15 DIAGNOSIS — T781XXA Other adverse food reactions, not elsewhere classified, initial encounter: Secondary | ICD-10-CM

## 2012-06-15 MED ORDER — CHOLECALCIFEROL 25 MCG (1000 UT) PO TABS: 1000.0000 [IU] | ORAL_TABLET | Freq: Every day | ORAL | Status: DC

## 2012-06-15 MED ORDER — ZOLPIDEM TARTRATE 5 MG PO TABS: 5.0000 mg | ORAL_TABLET | Freq: Every evening | ORAL | Status: DC | PRN

## 2012-06-15 MED ORDER — ALIGN 4 MG PO CAPS: 1.0000 | ORAL_CAPSULE | Freq: Every day | ORAL | Status: DC

## 2012-06-15 MED ORDER — OMEPRAZOLE 20 MG PO CPDR: 20.0000 mg | DELAYED_RELEASE_CAPSULE | Freq: Every day | ORAL | Status: DC

## 2012-06-15 MED ORDER — CYANOCOBALAMIN 1000 MCG/ML IJ SOLN: 1000.0000 ug | INTRAMUSCULAR | Status: DC

## 2012-06-15 MED ORDER — DESOGESTREL-ETHINYL ESTRADIOL 0.15-0.02/0.01 MG (21/5) PO TABS: 1.0000 | ORAL_TABLET | Freq: Every day | ORAL | Status: DC

## 2012-06-15 MED ORDER — DIPHENHYDRAMINE HCL 25 MG PO TABS: 25.0000 mg | ORAL_TABLET | Freq: Three times a day (TID) | ORAL | Status: DC | PRN

## 2012-06-15 MED ORDER — AMITRIPTYLINE HCL 10 MG PO TABS: 10.0000 mg | ORAL_TABLET | Freq: Every day | ORAL | Status: DC

## 2012-06-15 MED ORDER — PREDNISONE 10 MG PO TABS: ORAL_TABLET | ORAL | Status: DC

## 2012-06-15 MED ORDER — IBUPROFEN 200 MG PO TABS: 200.0000 mg | ORAL_TABLET | Freq: Two times a day (BID) | ORAL | Status: DC

## 2012-06-15 NOTE — Assessment & Plan Note (Signed)
Risks associated with treatment noncompliance were discussed. Compliance was encouraged. Re-start Vit D 

## 2012-06-15 NOTE — Progress Notes (Signed)
   Subjective:    HPI  The patient is here for a wellness exam. The patient has been doing well overall without major physical  issues going on lately.   BP Readings from Last 3 Encounters:  06/15/12 118/70  02/22/11 110/62  10/08/10 120/70   Wt Readings from Last 3 Encounters:  06/15/12 138 lb (62.596 kg)  10/08/10 139 lb (63.05 kg)  07/31/09 139 lb (63.05 kg)        Review of Systems  Constitutional: Positive for fatigue. Negative for fever and chills.  HENT: Negative for sneezing and tinnitus.   Eyes: Negative for redness and itching.  Respiratory: Negative for shortness of breath.   Cardiovascular: Negative for leg swelling.  Gastrointestinal: Negative for abdominal distention.  Genitourinary: Negative for dysuria.  Musculoskeletal: Negative for back pain.  Psychiatric/Behavioral: Positive for sleep disturbance. Negative for suicidal ideas, self-injury and dysphoric mood.       Objective:   Physical Exam  Constitutional: She appears well-developed and well-nourished. No distress.  HENT:  Head: Normocephalic.  Right Ear: External ear normal.  Left Ear: External ear normal.  Nose: Nose normal.  Mouth/Throat: Oropharynx is clear and moist.  Eyes: Conjunctivae normal are normal. Pupils are equal, round, and reactive to light. Right eye exhibits no discharge. Left eye exhibits no discharge.  Neck: Normal range of motion. Neck supple. No JVD present. No tracheal deviation present. No thyromegaly present.  Cardiovascular: Normal rate, regular rhythm and normal heart sounds.   Pulmonary/Chest: No stridor. No respiratory distress. She has no wheezes.  Abdominal: Soft. Bowel sounds are normal. She exhibits no distension and no mass. There is no tenderness. There is no rebound and no guarding.  Musculoskeletal: She exhibits no edema and no tenderness.  Lymphadenopathy:    She has no cervical adenopathy.  Neurological: She displays normal reflexes. No cranial nerve  deficit. She exhibits normal muscle tone. Coordination normal.  Skin: No rash noted. No erythema.  Psychiatric: She has a normal mood and affect. Her behavior is normal. Judgment and thought content normal.   Lab Results  Component Value Date   WBC 7.0 10/04/2010   HGB 13.3 10/04/2010   HCT 38.0 10/04/2010   PLT 167.0 10/04/2010   CHOL 200 10/04/2010   TRIG 65.0 10/04/2010   HDL 78.00 10/04/2010   ALT 21 10/04/2010   AST 18 10/04/2010   NA 137 10/04/2010   K 4.2 10/04/2010   CL 102 10/04/2010   CREATININE 0.7 10/04/2010   BUN 14 10/04/2010   CO2 28 10/04/2010   TSH 3.52 10/04/2010          Assessment & Plan:

## 2012-06-15 NOTE — Assessment & Plan Note (Signed)
Continue with current prescription therapy as reflected on the Med list.  

## 2012-06-15 NOTE — Assessment & Plan Note (Addendum)
We discussed age appropriate health related issues, including available/recomended screening tests and vaccinations. We discussed a need for adhering to healthy diet and exercise. Labs/EKG were reviewed/ordered. All questions were answered.  All questions were answered. She has a GYN.

## 2012-06-17 DIAGNOSIS — Z91018 Allergy to other foods: Secondary | ICD-10-CM | POA: Insufficient documentation

## 2012-06-17 NOTE — Assessment & Plan Note (Signed)
Avoidance  Prednisone, benadryl prn

## 2012-06-18 ENCOUNTER — Other Ambulatory Visit (INDEPENDENT_AMBULATORY_CARE_PROVIDER_SITE_OTHER): Payer: BC Managed Care – PPO

## 2012-06-18 DIAGNOSIS — Z0000 Encounter for general adult medical examination without abnormal findings: Secondary | ICD-10-CM

## 2012-06-18 DIAGNOSIS — E538 Deficiency of other specified B group vitamins: Secondary | ICD-10-CM

## 2012-06-18 DIAGNOSIS — E559 Vitamin D deficiency, unspecified: Secondary | ICD-10-CM

## 2012-06-18 LAB — URINALYSIS
Hgb urine dipstick: NEGATIVE
Nitrite: NEGATIVE
Total Protein, Urine: NEGATIVE
Urine Glucose: NEGATIVE
pH: 6 (ref 5.0–8.0)

## 2012-06-18 LAB — HEPATIC FUNCTION PANEL
AST: 21 U/L (ref 0–37)
Albumin: 4.1 g/dL (ref 3.5–5.2)
Alkaline Phosphatase: 52 U/L (ref 39–117)
Bilirubin, Direct: 0 mg/dL (ref 0.0–0.3)

## 2012-06-18 LAB — CBC WITH DIFFERENTIAL/PLATELET
Basophils Absolute: 0 10*3/uL (ref 0.0–0.1)
Basophils Relative: 0.5 % (ref 0.0–3.0)
Eosinophils Relative: 6.9 % — ABNORMAL HIGH (ref 0.0–5.0)
HCT: 38.3 % (ref 36.0–46.0)
Hemoglobin: 13 g/dL (ref 12.0–15.0)
Lymphocytes Relative: 29.8 % (ref 12.0–46.0)
Lymphs Abs: 1.9 10*3/uL (ref 0.7–4.0)
Monocytes Relative: 7 % (ref 3.0–12.0)
Neutro Abs: 3.6 10*3/uL (ref 1.4–7.7)
RBC: 4.16 Mil/uL (ref 3.87–5.11)
RDW: 12.6 % (ref 11.5–14.6)
WBC: 6.4 10*3/uL (ref 4.5–10.5)

## 2012-06-18 LAB — BASIC METABOLIC PANEL WITH GFR
BUN: 14 mg/dL (ref 6–23)
CO2: 27 meq/L (ref 19–32)
Calcium: 8.8 mg/dL (ref 8.4–10.5)
Chloride: 101 meq/L (ref 96–112)
Creatinine, Ser: 0.7 mg/dL (ref 0.4–1.2)
GFR: 90.89 mL/min
Glucose, Bld: 89 mg/dL (ref 70–99)
Potassium: 3.8 meq/L (ref 3.5–5.1)
Sodium: 136 meq/L (ref 135–145)

## 2012-06-18 LAB — LIPID PANEL
Cholesterol: 180 mg/dL (ref 0–200)
HDL: 68.4 mg/dL
LDL Cholesterol: 102 mg/dL — ABNORMAL HIGH (ref 0–99)
Total CHOL/HDL Ratio: 3
Triglycerides: 50 mg/dL (ref 0.0–149.0)
VLDL: 10 mg/dL (ref 0.0–40.0)

## 2012-06-18 LAB — TSH: TSH: 3.87 u[IU]/mL (ref 0.35–5.50)

## 2012-06-19 ENCOUNTER — Telehealth: Payer: Self-pay | Admitting: Internal Medicine

## 2012-06-19 MED ORDER — ERGOCALCIFEROL 1.25 MG (50000 UT) PO CAPS: 50000.0000 [IU] | ORAL_CAPSULE | ORAL | Status: DC

## 2012-06-19 NOTE — Telephone Encounter (Signed)
Pt informed

## 2012-06-19 NOTE — Telephone Encounter (Signed)
[name], please, inform patient that her vit D is low Start Vit D Rx Thx

## 2013-07-30 ENCOUNTER — Other Ambulatory Visit: Payer: Self-pay | Admitting: Internal Medicine

## 2013-08-09 ENCOUNTER — Encounter: Payer: Self-pay | Admitting: Internal Medicine

## 2013-08-09 ENCOUNTER — Ambulatory Visit (INDEPENDENT_AMBULATORY_CARE_PROVIDER_SITE_OTHER): Payer: BC Managed Care – PPO | Admitting: Internal Medicine

## 2013-08-09 VITALS — BP 106/60 | HR 76 | Temp 98.4°F | Ht 65.0 in | Wt 140.0 lb

## 2013-08-09 DIAGNOSIS — Z0000 Encounter for general adult medical examination without abnormal findings: Secondary | ICD-10-CM

## 2013-08-09 DIAGNOSIS — E559 Vitamin D deficiency, unspecified: Secondary | ICD-10-CM

## 2013-08-09 DIAGNOSIS — E538 Deficiency of other specified B group vitamins: Secondary | ICD-10-CM

## 2013-08-09 DIAGNOSIS — Z129 Encounter for screening for malignant neoplasm, site unspecified: Secondary | ICD-10-CM

## 2013-08-09 DIAGNOSIS — M545 Low back pain, unspecified: Secondary | ICD-10-CM

## 2013-08-09 DIAGNOSIS — N951 Menopausal and female climacteric states: Secondary | ICD-10-CM | POA: Insufficient documentation

## 2013-08-09 MED ORDER — ZOLPIDEM TARTRATE 5 MG PO TABS: 5.0000 mg | ORAL_TABLET | Freq: Every evening | ORAL | Status: DC | PRN

## 2013-08-09 MED ORDER — PREDNISONE 10 MG PO TABS: ORAL_TABLET | ORAL | Status: DC

## 2013-08-09 MED ORDER — OMEPRAZOLE 20 MG PO CPDR: 20.0000 mg | DELAYED_RELEASE_CAPSULE | Freq: Every day | ORAL | Status: DC

## 2013-08-09 MED ORDER — IBUPROFEN 200 MG PO TABS: 200.0000 mg | ORAL_TABLET | Freq: Two times a day (BID) | ORAL | Status: DC

## 2013-08-09 MED ORDER — ALIGN 4 MG PO CAPS: 1.0000 | ORAL_CAPSULE | Freq: Every day | ORAL | Status: DC

## 2013-08-09 MED ORDER — CYANOCOBALAMIN 1000 MCG/ML IJ SOLN: 1000.0000 ug | INTRAMUSCULAR | Status: DC

## 2013-08-09 MED ORDER — "INSULIN SYRINGE-NEEDLE U-100 25G X 1"" 1 ML MISC": Status: DC

## 2013-08-09 MED ORDER — PSEUDOEPHEDRINE HCL ER 120 MG PO TB12: 120.0000 mg | ORAL_TABLET | Freq: Two times a day (BID) | ORAL | Status: DC | PRN

## 2013-08-09 MED ORDER — ACETAMINOPHEN 500 MG PO TABS
500.0000 mg | ORAL_TABLET | Freq: Four times a day (QID) | ORAL | Status: DC | PRN
Start: 2013-08-09 — End: 2014-08-15

## 2013-08-09 MED ORDER — AMITRIPTYLINE HCL 10 MG PO TABS: ORAL_TABLET | ORAL | Status: DC

## 2013-08-09 NOTE — Assessment & Plan Note (Signed)
Labs  Continue with current prescription therapy as reflected on the Med list.  

## 2013-08-09 NOTE — Assessment & Plan Note (Signed)
Per GYN 

## 2013-08-09 NOTE — Assessment & Plan Note (Signed)
[employer] work station

## 2013-08-09 NOTE — Progress Notes (Signed)
Pre visit review using our clinic review tool, if applicable. No additional management support is needed unless otherwise documented below in the visit note. 

## 2013-08-09 NOTE — Assessment & Plan Note (Signed)
Continue with current prescription therapy as reflected on the Med list. Labs  

## 2013-08-09 NOTE — Assessment & Plan Note (Signed)
We discussed age appropriate health related issues, including available/recomended screening tests and vaccinations. We discussed a need for adhering to healthy diet and exercise. Labs/EKG were reviewed/ordered. All questions were answered. She has a GYN in [place].

## 2013-08-12 ENCOUNTER — Encounter: Payer: Self-pay | Admitting: Sports Medicine

## 2013-08-12 ENCOUNTER — Other Ambulatory Visit: Payer: Self-pay | Admitting: *Deleted

## 2013-08-12 ENCOUNTER — Ambulatory Visit (INDEPENDENT_AMBULATORY_CARE_PROVIDER_SITE_OTHER): Payer: BC Managed Care – PPO | Admitting: Sports Medicine

## 2013-08-12 VITALS — BP 107/71 | HR 66 | Ht 65.0 in | Wt 140.0 lb

## 2013-08-12 DIAGNOSIS — M25519 Pain in unspecified shoulder: Secondary | ICD-10-CM

## 2013-08-12 DIAGNOSIS — Z1231 Encounter for screening mammogram for malignant neoplasm of breast: Secondary | ICD-10-CM

## 2013-08-13 ENCOUNTER — Other Ambulatory Visit: Payer: Self-pay | Admitting: *Deleted

## 2013-08-13 ENCOUNTER — Other Ambulatory Visit (INDEPENDENT_AMBULATORY_CARE_PROVIDER_SITE_OTHER): Payer: BC Managed Care – PPO

## 2013-08-13 ENCOUNTER — Ambulatory Visit
Admission: RE | Admit: 2013-08-13 | Discharge: 2013-08-13 | Disposition: A | Discharge status: Home / Self Care | Payer: BC Managed Care – PPO | Source: Ambulatory Visit | Attending: Sports Medicine | Admitting: Sports Medicine

## 2013-08-13 DIAGNOSIS — M545 Low back pain, unspecified: Secondary | ICD-10-CM

## 2013-08-13 DIAGNOSIS — M542 Cervicalgia: Secondary | ICD-10-CM

## 2013-08-13 DIAGNOSIS — E538 Deficiency of other specified B group vitamins: Secondary | ICD-10-CM

## 2013-08-13 DIAGNOSIS — Z0000 Encounter for general adult medical examination without abnormal findings: Secondary | ICD-10-CM

## 2013-08-13 LAB — HEPATIC FUNCTION PANEL
ALBUMIN: 4 g/dL (ref 3.5–5.2)
ALK PHOS: 52 U/L (ref 39–117)
ALT: 18 U/L (ref 0–35)
AST: 18 U/L (ref 0–37)
Bilirubin, Direct: 0.1 mg/dL (ref 0.0–0.3)
Total Bilirubin: 0.7 mg/dL (ref 0.3–1.2)
Total Protein: 6.4 g/dL (ref 6.0–8.3)

## 2013-08-13 LAB — URINALYSIS
Bilirubin Urine: NEGATIVE
HGB URINE DIPSTICK: NEGATIVE
Ketones, ur: NEGATIVE
Leukocytes, UA: NEGATIVE
NITRITE: NEGATIVE
SPECIFIC GRAVITY, URINE: 1.015 (ref 1.000–1.030)
Total Protein, Urine: NEGATIVE
Urine Glucose: NEGATIVE
Urobilinogen, UA: 0.2 (ref 0.0–1.0)
pH: 6.5 (ref 5.0–8.0)

## 2013-08-13 LAB — LIPID PANEL
CHOL/HDL RATIO: 3
Cholesterol: 202 mg/dL — ABNORMAL HIGH (ref 0–200)
HDL: 75 mg/dL (ref >39.00)
LDL CALC: 121 mg/dL — AB (ref 0–99)
TRIGLYCERIDES: 31 mg/dL (ref 0.0–149.0)
VLDL: 6.2 mg/dL (ref 0.0–40.0)

## 2013-08-13 LAB — CBC WITH DIFFERENTIAL/PLATELET
BASOS PCT: 0.7 % (ref 0.0–3.0)
Basophils Absolute: 0 10*3/uL (ref 0.0–0.1)
Eosinophils Absolute: 0.4 10*3/uL (ref 0.0–0.7)
Eosinophils Relative: 7.4 % — ABNORMAL HIGH (ref 0.0–5.0)
HCT: 38.6 % (ref 36.0–46.0)
HEMOGLOBIN: 12.9 g/dL (ref 12.0–15.0)
LYMPHS PCT: 40.2 % (ref 12.0–46.0)
Lymphs Abs: 2 10*3/uL (ref 0.7–4.0)
MCHC: 33.5 g/dL (ref 30.0–36.0)
MCV: 91.8 fl (ref 78.0–100.0)
Monocytes Absolute: 0.3 10*3/uL (ref 0.1–1.0)
Monocytes Relative: 5.8 % (ref 3.0–12.0)
NEUTROS ABS: 2.3 10*3/uL (ref 1.4–7.7)
Neutrophils Relative %: 45.9 % (ref 43.0–77.0)
Platelets: 165 10*3/uL (ref 150.0–400.0)
RBC: 4.21 Mil/uL (ref 3.87–5.11)
RDW: 13.2 % (ref 11.5–14.6)
WBC: 4.9 10*3/uL (ref 4.5–10.5)

## 2013-08-13 LAB — BASIC METABOLIC PANEL
BUN: 15 mg/dL (ref 6–23)
CO2: 28 meq/L (ref 19–32)
Calcium: 9.1 mg/dL (ref 8.4–10.5)
Chloride: 104 mEq/L (ref 96–112)
Creatinine, Ser: 0.6 mg/dL (ref 0.4–1.2)
GFR: 102.02 mL/min (ref >60.00)
Glucose, Bld: 83 mg/dL (ref 70–99)
POTASSIUM: 4.1 meq/L (ref 3.5–5.1)
Sodium: 140 mEq/L (ref 135–145)

## 2013-08-13 LAB — VITAMIN B12: VITAMIN B 12: 383 pg/mL (ref 211–911)

## 2013-08-13 LAB — TSH: TSH: 3.37 u[IU]/mL (ref 0.35–5.50)

## 2013-08-13 NOTE — Progress Notes (Signed)
   Subjective:    Patient ID: [name], female    DOB: 06/17/1955, 58 y.o.   MRN: [id number]  HPI chief complaint: Left shoulder pain  Very pleasant 58 year old female comes in today complaining of 3 months of superior left shoulder pain. She states that she has had chronic intermittent parascapular pain in the past but she localizes her current pain along the left trapezius. She describes a "pulling sensation" that begins on the left side of her neck and radiates into the superior shoulder. It's present with activity but can also occur at rest. She denies radiating pain down the left arm. She gets intermittent numbness and tingling into the fourth and fifth fingers but this occurs only while sleeping. She has tried massage which has helped some. Yoga also seems to be of benefit. She uses topical Voltaren gel which "takes the edge off". She denies any deep-seated shoulder pain. She's not tried any medications and has not had any recent imaging. Past medical history and current medications are reviewed Allergies are reviewed She is a professor of entomology    Review of Systems As above    Objective:   Physical Exam Well-developed, well-nourished. No acute distress. Vital signs are reviewed. Patient sitting comfortably in the exam room. Cervical spine: There is tenderness to palpation along the left trapezius. Some mild spasm as well but no palpable trigger point. Cervical rotation to the right is 90, rotation to the left is 80. Full flexion and extension. No tenderness to palpation along cervical midline. Left shoulder: Full range of motion. No tenderness over the a.c. joint nor over the bicipital groove. Rotator cuff strength is 5/5. Negative empty can, negative Hawkins. Negative O'Briens. Neurological exam: Full-strength both upper extremities, reflexes are brisk and equal at the biceps, triceps, and brachial radialis tendons bilaterally. No atrophy. Good radial and ulnar pulses  bilaterally.       Assessment & Plan:  Left shoulder pain, likely referred pain from cervical degenerative disc disease versus cervical strain  X-rays of the cervical spine to evaluate degree of possible degenerative disc disease or facet arthropathy. I will call her with those results once available at [phone]. I will send her to physical therapy Barbaraann Barthel). She will be traveling until March 31 but will be back between March 31 and April 5. When I call her to discuss her x-rays we will set up a followup appointment sometime during that time.

## 2013-08-14 ENCOUNTER — Telehealth: Payer: Self-pay | Admitting: Sports Medicine

## 2013-08-14 NOTE — Telephone Encounter (Signed)
I spoke with the patient on the phone after reviewing x-rays of her cervical spine. She has some moderate disc space narrowing at C5-C6. She has an appointment with physical therapy scheduled for April 1. If she continues to have pain after starting physical therapy she will let me know. Otherwise, she will followup when necessary.

## 2013-08-28 ENCOUNTER — Ambulatory Visit
Admission: RE | Admit: 2013-08-28 | Discharge: 2013-08-28 | Disposition: A | Discharge status: Home / Self Care | Payer: BC Managed Care – PPO | Source: Ambulatory Visit | Attending: Internal Medicine | Admitting: Internal Medicine

## 2013-08-28 DIAGNOSIS — Z1231 Encounter for screening mammogram for malignant neoplasm of breast: Secondary | ICD-10-CM

## 2013-12-03 ENCOUNTER — Telehealth: Payer: Self-pay | Admitting: *Deleted

## 2013-12-03 MED ORDER — EPINEPHRINE 0.3 MG/0.3ML IJ SOAJ: 0.3000 mg | Freq: Once | INTRAMUSCULAR | Status: DC

## 2013-12-03 NOTE — Telephone Encounter (Signed)
Left msg needing refill on her epi pen. Notified pt rx has been sent to [facility]...Johny Chess

## 2014-05-21 ENCOUNTER — Encounter: Payer: Self-pay | Admitting: Family Medicine

## 2014-05-21 ENCOUNTER — Ambulatory Visit (INDEPENDENT_AMBULATORY_CARE_PROVIDER_SITE_OTHER): Payer: BC Managed Care – PPO | Admitting: Family Medicine

## 2014-05-21 VITALS — BP 108/62 | HR 68 | Temp 97.8°F | Ht 65.0 in | Wt 143.0 lb

## 2014-05-21 DIAGNOSIS — M7671 Peroneal tendinitis, right leg: Secondary | ICD-10-CM

## 2014-05-21 NOTE — Progress Notes (Signed)
Pre visit review using our clinic review tool, if applicable. No additional management support is needed unless otherwise documented below in the visit note.    [facility] [address] [place], [place] [address] Phone: [phone] Subjective:    I'm seeing this patient by the request  of:  Walker Kehr, MD   CC: Right ankle pain  HAL:[id number] [name] is a 58 y.o. female coming in with complaint of right ankle pain. Patient's that she has had this pain for approximate 3 months. Patient with #1 she was doing per pes. Patient states that she did have swelling immediately. Patient states that the pain has improved but continues to have some difficulty. Patient discusses it as a dull aching sensation after activity. Points more to the anterior lateral aspects of the foot. Denies any radiation of pain or any numbness. Patient states though when she does have the pain she walks differently which does cause some pain going upper leg. Patient puts the severity of 6 out of 10. Patient would like to continue to be active but finds it difficult because every time she works out she has pain afterwards. Vision did see another provider and was told that it was a sprain and that it would improve. Patient was told that she had a leg length discrepancy which was causing her pain.     Past medical history, social, surgical and family history all reviewed in electronic medical record.   Review of Systems: No headache, visual changes, nausea, vomiting, diarrhea, constipation, dizziness, abdominal pain, skin rash, fevers, chills, night sweats, weight loss, swollen lymph nodes, body aches, joint swelling, muscle aches, chest pain, shortness of breath, mood changes.   Objective Blood pressure 108/62, pulse 68, temperature 97.8 F (36.6 C), temperature source Oral, height 5\' 5"  (1.651 m), weight 143 lb (64.864 kg), SpO2 99 %.  General: No apparent distress alert and  oriented x3 mood and affect normal, dressed appropriately.  HEENT: Pupils equal, extraocular movements intact  Respiratory: Patient's speak in full sentences and does not appear short of breath  Cardiovascular: No lower extremity edema, non tender, no erythema  Skin: Warm dry intact with no signs of infection or rash on extremities or on axial skeleton.  Abdomen: Soft nontender  Neuro: Cranial nerves II through XII are intact, neurovascularly intact in all extremities with 2+ DTRs and 2+ pulses.  Lymph: No lymphadenopathy of posterior or anterior cervical chain or axillae bilaterally.  Gait normal with good balance and coordination.  MSK:  Non tender with full range of motion and good stability and symmetric strength and tone of shoulders, elbows, wrist, hip, knee and bilaterally.  Ankle: Right No visible erythema or swelling. Range of motion is full in all directions. Strength is 4/5 in all directions. Stable lateral and medial ligaments; squeeze test and kleiger test unremarkable; Talar dome minimally tender compared to contralateral side No pain at base of 5th MT; No tenderness over cuboid; No tenderness over N spot or navicular prominence No tenderness on posterior aspects of lateral and medial malleolus Mild tenderness over the peroneal tendons. Negative tarsal tunnel tinel's Able to walk 4 steps. Contralateral ankle unremarkable  MSK US performed of: Right This study was ordered, performed, and interpreted by Charlann Boxer D.O.  Foot/Ankle:   All structures visualized.   Talar dome to secondary awareness seems to have a callus formation with increasing Doppler flow. No fracture noted. Ankle mortise without effusion. Peroneus longus and brevis tendons  mild tendon sheath effusion noted no tear appreciated.[name] Posterior tibialis, flexor hallucis longus, and flexor digitorum longus tendons unremarkable on long and transverse views without sheath effusions. Achilles tendon visualized  along length of tendon and unremarkable on long and transverse views without sheath effusion. Anterior Talofibular Ligament and Calcaneofibular Ligaments unremarkable and intact. Deltoid Ligament unremarkable and intact. Plantar fascia intact and without effusion, normal thickness. No increased doppler signal, cap sign, or thickening of tibial cortex. Power doppler signal normal.  IMPRESSION:  Questionable healing talar dome injury as well as peroneal tendinitis.      Impression and Recommendations:     This case required medical decision making of moderate complexity.

## 2014-05-21 NOTE — Assessment & Plan Note (Signed)
Patient does have more of a peroneal tendinitis that I think is giving her chronic pain going upper leg somewhat. I do think that there is a possible talar dome fracture that seems to be healing. Patient does have a vitamin D deficiency and we discussed how this is important to help with healing. Patient was given a lace up ankle brace for stability that she will wear with exercising. We discussed other over-the-counter medications a could be beneficial. Home exercise program given. Patient will follow-up before she leaves town to return to work.

## 2014-05-21 NOTE — Patient Instructions (Signed)
Good to see you Ice 20 minutes 2 times daily. Usually after activity and before bed. Exercises 3 times a week.  Wear brace with exercises for the next 4 weeks Vitamin D 4000 IU daily for 2 weeks then 2000 IU daily See me again before you leave for [place].

## 2014-05-25 ENCOUNTER — Other Ambulatory Visit: Payer: Self-pay | Admitting: Internal Medicine

## 2014-05-28 NOTE — Telephone Encounter (Signed)
[facility]ed [facility] spoke with Ray gave md approval.../lmb

## 2014-06-06 ENCOUNTER — Ambulatory Visit: Payer: BC Managed Care – PPO | Admitting: Family Medicine

## 2014-08-15 ENCOUNTER — Ambulatory Visit (INDEPENDENT_AMBULATORY_CARE_PROVIDER_SITE_OTHER): Payer: BLUE CROSS/BLUE SHIELD | Admitting: Internal Medicine

## 2014-08-15 ENCOUNTER — Encounter: Payer: Self-pay | Admitting: Internal Medicine

## 2014-08-15 VITALS — BP 122/80 | HR 71 | Temp 98.3°F | Resp 16 | Ht 65.0 in | Wt 137.0 lb

## 2014-08-15 DIAGNOSIS — Z0000 Encounter for general adult medical examination without abnormal findings: Secondary | ICD-10-CM

## 2014-08-15 DIAGNOSIS — E538 Deficiency of other specified B group vitamins: Secondary | ICD-10-CM

## 2014-08-15 DIAGNOSIS — E559 Vitamin D deficiency, unspecified: Secondary | ICD-10-CM

## 2014-08-15 MED ORDER — CYANOCOBALAMIN 1000 MCG/ML IJ SOLN: 1000.0000 ug | INTRAMUSCULAR | Status: DC

## 2014-08-15 MED ORDER — PREDNISONE 10 MG PO TABS: ORAL_TABLET | ORAL | Status: DC

## 2014-08-15 MED ORDER — CHOLECALCIFEROL 25 MCG (1000 UT) PO TABS: 1000.0000 [IU] | ORAL_TABLET | Freq: Every day | ORAL | Status: DC

## 2014-08-15 MED ORDER — EPINEPHRINE 0.3 MG/0.3ML IJ SOAJ: 0.3000 mg | Freq: Once | INTRAMUSCULAR | Status: DC

## 2014-08-15 MED ORDER — OMEPRAZOLE 20 MG PO CPDR: 20.0000 mg | DELAYED_RELEASE_CAPSULE | Freq: Every day | ORAL | Status: DC

## 2014-08-15 MED ORDER — "INSULIN SYRINGE-NEEDLE U-100 25G X 1"" 1 ML MISC": Status: DC

## 2014-08-15 MED ORDER — PSEUDOEPHEDRINE HCL ER 120 MG PO TB12: 120.0000 mg | ORAL_TABLET | Freq: Two times a day (BID) | ORAL | Status: DC | PRN

## 2014-08-15 MED ORDER — DIPHENHYDRAMINE HCL 25 MG PO TABS: 25.0000 mg | ORAL_TABLET | Freq: Three times a day (TID) | ORAL | Status: DC | PRN

## 2014-08-15 MED ORDER — ACETAMINOPHEN 500 MG PO TABS: 500.0000 mg | ORAL_TABLET | Freq: Four times a day (QID) | ORAL | Status: DC | PRN

## 2014-08-15 MED ORDER — IBUPROFEN 200 MG PO TABS: 200.0000 mg | ORAL_TABLET | Freq: Two times a day (BID) | ORAL | Status: DC

## 2014-08-15 MED ORDER — AMITRIPTYLINE HCL 10 MG PO TABS: 10.0000 mg | ORAL_TABLET | Freq: Every day | ORAL | Status: DC

## 2014-08-15 MED ORDER — SACCHAROMYCES BOULARDII 250 MG PO CAPS: 250.0000 mg | ORAL_CAPSULE | Freq: Two times a day (BID) | ORAL | Status: DC

## 2014-08-15 MED ORDER — ZOLPIDEM TARTRATE 5 MG PO TABS: 5.0000 mg | ORAL_TABLET | Freq: Every evening | ORAL | Status: DC | PRN

## 2014-08-15 NOTE — Assessment & Plan Note (Signed)
Vit B12 - on Rx Labs

## 2014-08-15 NOTE — Assessment & Plan Note (Signed)
Labs On B12 

## 2014-08-15 NOTE — Progress Notes (Signed)
   Subjective:    HPI  The patient is here for a wellness exam. The patient has been doing well overall without major physical  issues going on lately.   BP Readings from Last 3 Encounters:  08/15/14 122/80  05/21/14 108/62  08/12/13 107/71   Wt Readings from Last 3 Encounters:  08/15/14 137 lb (62.143 kg)  05/21/14 143 lb (64.864 kg)  08/12/13 140 lb (63.504 kg)    Review of Systems  Constitutional: Positive for fatigue. Negative for fever and chills.  HENT: Negative for sneezing and tinnitus.   Eyes: Negative for redness and itching.  Respiratory: Negative for shortness of breath.   Cardiovascular: Negative for leg swelling.  Gastrointestinal: Negative for abdominal distention.  Genitourinary: Negative for dysuria.  Musculoskeletal: Negative for back pain.  Psychiatric/Behavioral: Positive for sleep disturbance. Negative for suicidal ideas, self-injury and dysphoric mood.       Objective:   Physical Exam  Constitutional: She appears well-developed and well-nourished. No distress.  HENT:  Head: Normocephalic.  Right Ear: External ear normal.  Left Ear: External ear normal.  Nose: Nose normal.  Mouth/Throat: Oropharynx is clear and moist.  Eyes: Conjunctivae are normal. Pupils are equal, round, and reactive to light. Right eye exhibits no discharge. Left eye exhibits no discharge.  Neck: Normal range of motion. Neck supple. No JVD present. No tracheal deviation present. No thyromegaly present.  Cardiovascular: Normal rate, regular rhythm and normal heart sounds.   Pulmonary/Chest: No stridor. No respiratory distress. She has no wheezes.  Abdominal: Soft. Bowel sounds are normal. She exhibits no distension and no mass. There is no tenderness. There is no rebound and no guarding.  Musculoskeletal: She exhibits no edema or tenderness.  Lymphadenopathy:    She has no cervical adenopathy.  Neurological: She displays normal reflexes. No cranial nerve deficit. She exhibits  normal muscle tone. Coordination normal.  Skin: No rash noted. No erythema.  Psychiatric: She has a normal mood and affect. Her behavior is normal. Judgment and thought content normal.   Lab Results  Component Value Date   WBC 4.9 08/13/2013   HGB 12.9 08/13/2013   HCT 38.6 08/13/2013   PLT 165.0 08/13/2013   CHOL 202* 08/13/2013   TRIG 31.0 08/13/2013   HDL 75.00 08/13/2013   ALT 18 08/13/2013   AST 18 08/13/2013   NA 140 08/13/2013   K 4.1 08/13/2013   CL 104 08/13/2013   CREATININE 0.6 08/13/2013   BUN 15 08/13/2013   CO2 28 08/13/2013   TSH 3.37 08/13/2013       Assessment & Plan:

## 2014-08-15 NOTE — Assessment & Plan Note (Addendum)
We discussed age appropriate health related issues, including available/recomended screening tests and vaccinations. We discussed a need for adhering to healthy diet and exercise. Labs/EKG were reviewed/ordered. All questions were answered.  She has a GYN at [facility]. Ophth at [facility]. Colon up to date. Labs Ophth appt

## 2014-08-15 NOTE — Progress Notes (Signed)
Pre visit review using our clinic review tool, if applicable. No additional management support is needed unless otherwise documented below in the visit note. 

## 2014-10-21 ENCOUNTER — Other Ambulatory Visit: Payer: Self-pay | Admitting: Internal Medicine

## 2014-10-21 ENCOUNTER — Other Ambulatory Visit (INDEPENDENT_AMBULATORY_CARE_PROVIDER_SITE_OTHER): Payer: BLUE CROSS/BLUE SHIELD

## 2014-10-21 DIAGNOSIS — E538 Deficiency of other specified B group vitamins: Secondary | ICD-10-CM | POA: Diagnosis not present

## 2014-10-21 DIAGNOSIS — Z0000 Encounter for general adult medical examination without abnormal findings: Secondary | ICD-10-CM | POA: Diagnosis not present

## 2014-10-21 DIAGNOSIS — E559 Vitamin D deficiency, unspecified: Secondary | ICD-10-CM | POA: Diagnosis not present

## 2014-10-21 LAB — LIPID PANEL
CHOLESTEROL: 185 mg/dL (ref 0–200)
HDL: 66 mg/dL (ref >39.00)
LDL Cholesterol: 107 mg/dL — ABNORMAL HIGH (ref 0–99)
NONHDL: 119
Total CHOL/HDL Ratio: 3
Triglycerides: 60 mg/dL (ref 0.0–149.0)
VLDL: 12 mg/dL (ref 0.0–40.0)

## 2014-10-21 LAB — CBC WITH DIFFERENTIAL/PLATELET
Basophils Absolute: 0 10*3/uL (ref 0.0–0.1)
Basophils Relative: 0.6 % (ref 0.0–3.0)
EOS PCT: 8.4 % — AB (ref 0.0–5.0)
Eosinophils Absolute: 0.5 10*3/uL (ref 0.0–0.7)
HEMATOCRIT: 39.4 % (ref 36.0–46.0)
HEMOGLOBIN: 13.6 g/dL (ref 12.0–15.0)
LYMPHS ABS: 1.7 10*3/uL (ref 0.7–4.0)
LYMPHS PCT: 30.6 % (ref 12.0–46.0)
MCHC: 34.4 g/dL (ref 30.0–36.0)
MCV: 89.8 fl (ref 78.0–100.0)
Monocytes Absolute: 0.4 10*3/uL (ref 0.1–1.0)
Monocytes Relative: 6.9 % (ref 3.0–12.0)
Neutro Abs: 2.9 10*3/uL (ref 1.4–7.7)
Neutrophils Relative %: 53.5 % (ref 43.0–77.0)
PLATELETS: 170 10*3/uL (ref 150.0–400.0)
RBC: 4.39 Mil/uL (ref 3.87–5.11)
RDW: 12.8 % (ref 11.5–15.5)
WBC: 5.4 10*3/uL (ref 4.0–10.5)

## 2014-10-21 LAB — BASIC METABOLIC PANEL
BUN: 10 mg/dL (ref 6–23)
CHLORIDE: 102 meq/L (ref 96–112)
CO2: 30 mEq/L (ref 19–32)
Calcium: 9.2 mg/dL (ref 8.4–10.5)
Creatinine, Ser: 0.67 mg/dL (ref 0.40–1.20)
GFR: 96.36 mL/min (ref >60.00)
GLUCOSE: 84 mg/dL (ref 70–99)
POTASSIUM: 3.7 meq/L (ref 3.5–5.1)
Sodium: 138 mEq/L (ref 135–145)

## 2014-10-21 LAB — VITAMIN B12: VITAMIN B 12: 240 pg/mL (ref 211–911)

## 2014-10-21 LAB — URINALYSIS, ROUTINE W REFLEX MICROSCOPIC
Bilirubin Urine: NEGATIVE
HGB URINE DIPSTICK: NEGATIVE
Ketones, ur: NEGATIVE
NITRITE: NEGATIVE
RBC / HPF: NONE SEEN (ref 0–?)
Specific Gravity, Urine: 1.01 (ref 1.000–1.030)
TOTAL PROTEIN, URINE-UPE24: NEGATIVE
Urine Glucose: NEGATIVE
Urobilinogen, UA: 0.2 (ref 0.0–1.0)
pH: 7.5 (ref 5.0–8.0)

## 2014-10-21 LAB — HEPATIC FUNCTION PANEL
ALK PHOS: 64 U/L (ref 39–117)
ALT: 20 U/L (ref 0–35)
AST: 27 U/L (ref 0–37)
Albumin: 4.2 g/dL (ref 3.5–5.2)
BILIRUBIN DIRECT: 0.1 mg/dL (ref 0.0–0.3)
BILIRUBIN TOTAL: 0.7 mg/dL (ref 0.2–1.2)
TOTAL PROTEIN: 6.8 g/dL (ref 6.0–8.3)

## 2014-10-21 LAB — TSH: TSH: 2.66 u[IU]/mL (ref 0.35–4.50)

## 2014-10-21 LAB — VITAMIN D 25 HYDROXY (VIT D DEFICIENCY, FRACTURES): VITD: 21.01 ng/mL — ABNORMAL LOW (ref 30.00–100.00)

## 2014-10-21 MED ORDER — ERGOCALCIFEROL 1.25 MG (50000 UT) PO CAPS: 50000.0000 [IU] | ORAL_CAPSULE | ORAL | Status: DC

## 2014-10-30 ENCOUNTER — Telehealth: Payer: Self-pay | Admitting: Internal Medicine

## 2014-10-30 NOTE — Telephone Encounter (Signed)
Pt called in and said that she needs smaller needles called in NOT the Insulin Syringe-Needle U-100 25G X 1" 1 ML MISC [[id number]]    [facility] [place] [facility] [address] [phone]

## 2014-10-31 MED ORDER — "INSULIN SYRINGE-NEEDLE U-100 27G X 5/8"" 1 ML MISC": 1.0000 | Status: DC

## 2014-10-31 NOTE — Telephone Encounter (Signed)
New Rx sent- see meds

## 2014-11-19 ENCOUNTER — Ambulatory Visit (INDEPENDENT_AMBULATORY_CARE_PROVIDER_SITE_OTHER): Payer: BLUE CROSS/BLUE SHIELD | Admitting: Sports Medicine

## 2014-11-19 ENCOUNTER — Encounter: Payer: Self-pay | Admitting: Sports Medicine

## 2014-11-19 VITALS — BP 101/82 | Ht 64.57 in | Wt 140.0 lb

## 2014-11-19 DIAGNOSIS — M7582 Other shoulder lesions, left shoulder: Secondary | ICD-10-CM | POA: Diagnosis not present

## 2014-11-19 NOTE — Assessment & Plan Note (Signed)
With the spur being quite small we will work on trying to treat this with home exercises  Work on neck posture and protraction of the left shoulder Given scapular stabilization exercises and two stretches Rotator cuff rehabilitation exercises  Try to do these daily If this fails to respond in 2-3 weeks we should add nitroglycerin  Recheck in 6 weeks

## 2014-11-19 NOTE — Progress Notes (Signed)
Patient ID: MALU PELLEGRINI, female   DOB: Apr 16, 1956, 59 y.o.   MRN: [id number]  Patient enters with left shoulder pain She had seen Dr. 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 Palpation is normal with no tenderness over AC joint or bicipital groove. ROM is full in all planes. Rotator cuff strength normal throughout. + signs of impingement with Weakness and pain with Hawkin's tests, empty can. Speeds and Yergason's tests normal. No labral pathology noted with negative Obrien's, negative clunk and good stability. Normal scapular function observed. No painful arc and no drop arm sign. No apprehension sign  Neck rotation is overall pretty good but there is some slight limitation to rotation and lateral bending to the right This does not create pain  Ultrasound of left shoulder There is a small spur on the anterior humeral head that impinges on the supraspinatous tendon and causes some hypoechoic change Remainder of the supraspinatous tendon is normal Bicipital tendon infraspinatus teres minor and subscapularis are normal A.c. Joint is unremarkable

## 2014-11-28 ENCOUNTER — Encounter: Payer: Self-pay | Admitting: Family Medicine

## 2014-11-28 ENCOUNTER — Ambulatory Visit (INDEPENDENT_AMBULATORY_CARE_PROVIDER_SITE_OTHER): Payer: BLUE CROSS/BLUE SHIELD | Admitting: Family Medicine

## 2014-11-28 VITALS — BP 124/68 | Ht 64.0 in | Wt 140.0 lb

## 2014-11-28 DIAGNOSIS — IMO0001 Reserved for inherently not codable concepts without codable children: Secondary | ICD-10-CM

## 2014-11-28 DIAGNOSIS — S93402A Sprain of unspecified ligament of left ankle, initial encounter: Secondary | ICD-10-CM

## 2014-11-28 DIAGNOSIS — S93409A Sprain of unspecified ligament of unspecified ankle, initial encounter: Secondary | ICD-10-CM | POA: Insufficient documentation

## 2014-11-28 LAB — HM MAMMOGRAPHY: HM Mammogram: NORMAL (ref 0–4)

## 2014-11-28 NOTE — Progress Notes (Signed)
  [name] - 59 y.o. female MRN [id number]  Date of birth: 24-Jun-1955  SUBJECTIVE:  Including CC & ROS.  No chief complaint on file. [name] is a 58 yo female who presents with left ankle pain after a fall one week ago. Last Friday, she lost her footing while standing in her tub and she slipped forward but was able to catch herself without fully twisting her ankle. She had pain, swelling, and bruising in her lateral ankle so she went to urgent care. She was given crutches and told her it was likely a sprain. She wore the crutches for a day then borrowed a friend's CAM boot, which she wore for 5 days. She currently has no pain and is able to walk without pain or change in gait.   PHYSICAL EXAM:  VS: BP:124/68 mmHg  HR: bpm  TEMP: ( )  RESP:   HT:5\' 4"  (162.6 cm)   WT:140 lb (63.504 kg)  BMI:24.1 PHYSICAL EXAM: General: NAD Skin: no bruising, rashes noted MSK: L ankle: Full ROM without pain. No tenderness over base of 5th metatarsal, at calcaneus, posterior medial malleolus, posterior anterior malleolus. Minor tenderness over anterior talofibular ligament. Minimal swelling over anterior ankle joint. Talar tilt negative, Negative anterior drawer sign. No pain with weight bearing.  U/S L ankle: ATF intact. Small infusion in anterior ankle joint.   ASSESSMENT & PLAN:  Grade 1 ankle sprain. Patient was given a 2 week ankle rehab program with theraband exercises and was instructed to continue to ice if it swells. Exercise as tolerated.

## 2014-12-02 NOTE — Progress Notes (Signed)
Patient ID: [name], female   DOB: 1955-11-30, 59 y.o.   MRN: [id number] [name] Attending Note: I have seen and examined this patient with the medical student. I have  reviewed the history, physical examination, assessment and plan as documented in the medical student's note.  I agree with the medical student's note and findings, assessment and treatment plan as documented with the following additions or changes:  Grade 1 ankle sprain. Most important thing at this time is to complete rehabilitation program. I have given  her in handout form and my nurse has gone over the exercises with her as well as given her an elastic exercise band;  she can follow-up when necessary. I did recommend continued icing all she's doing the rehabilitation.

## 2014-12-26 ENCOUNTER — Other Ambulatory Visit (HOSPITAL_COMMUNITY)
Admission: RE | Admit: 2014-12-26 | Discharge: 2014-12-26 | Disposition: A | Discharge status: Home / Self Care | Payer: BLUE CROSS/BLUE SHIELD | Source: Ambulatory Visit | Attending: Women's Health | Admitting: Women's Health

## 2014-12-26 ENCOUNTER — Encounter: Payer: Self-pay | Admitting: Women's Health

## 2014-12-26 ENCOUNTER — Ambulatory Visit (INDEPENDENT_AMBULATORY_CARE_PROVIDER_SITE_OTHER): Payer: BLUE CROSS/BLUE SHIELD | Admitting: Women's Health

## 2014-12-26 VITALS — BP 110/78 | Ht 64.0 in | Wt 141.0 lb

## 2014-12-26 DIAGNOSIS — Z01419 Encounter for gynecological examination (general) (routine) without abnormal findings: Secondary | ICD-10-CM

## 2014-12-26 DIAGNOSIS — Z13820 Encounter for screening for osteoporosis: Secondary | ICD-10-CM | POA: Diagnosis not present

## 2014-12-26 DIAGNOSIS — Z01411 Encounter for gynecological examination (general) (routine) with abnormal findings: Secondary | ICD-10-CM | POA: Diagnosis present

## 2014-12-26 DIAGNOSIS — Z1151 Encounter for screening for human papillomavirus (HPV): Secondary | ICD-10-CM | POA: Insufficient documentation

## 2014-12-26 NOTE — Progress Notes (Signed)
NIASHA DEVINS 1955-07-19 [id number]    History:    Presents for annual exam.  Postmenopausal/no bleeding/no HRT. Normal Pap and mammogram history. Negative colonoscopy 2009. Labs at primary care. Vitamin D 21 at primary care had been on 50,000 weekly. Sexually active denies need for STD screen.  Past medical history, past surgical history, family history and social history were all reviewed and documented in the EPIC chart. 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 Daughter at med school at [employer].  ROS:  A ROS was performed and pertinent positives and negatives are included.  Exam:  Filed Vitals:   12/26/14 1528  BP: 110/78    General appearance:  Normal Thyroid:  Symmetrical, normal in size, without palpable masses or nodularity. Respiratory  Auscultation:  Clear without wheezing or rhonchi Cardiovascular  Auscultation:  Regular rate, without rubs, murmurs or gallops  Edema/varicosities:  Not grossly evident Abdominal  Soft,nontender, without masses, guarding or rebound.  Liver/spleen:  No organomegaly noted  Hernia:  None appreciated  Skin  Inspection:  Grossly normal   Breasts: Examined lying and sitting.     Right: Without masses, retractions, discharge or axillary adenopathy.     Left: Without masses, retractions, discharge or axillary adenopathy. Gentitourinary   Inguinal/mons:  Normal without inguinal adenopathy  External genitalia:  Normal  BUS/Urethra/Skene's glands:  Normal  Vagina:  Normal  Cervix:  Normal  Uterus:   normal in size, shape and contour.  Midline and mobile  Adnexa/parametria:     Rt: Without masses or tenderness.   Lt: Without masses or tenderness.  Anus and perineum: Normal  Digital rectal exam: Normal sphincter tone without palpated masses or tenderness  Assessment/Plan:  59 y.o. DWF G1 P1 for annual  exam.     Postmenopausal/no HRT/no bleeding Primary care manages labs  Plan: SBE's, annual screening mammogram, overdue instructed to schedule breast center. Continue regular exercise showed a, walking has active healthy lifestyle. DEXA, instructed to schedule Will schedule here. Home safety, fall prevention. Encouraged vitamin D 2000 daily as recommended per primary care. UA, Pap with HR HPV typing, new screening guidelines reviewed.   Huel Cote [facility], 4:27 PM 12/26/2014

## 2014-12-26 NOTE — Patient Instructions (Signed)
Health Recommendations for Postmenopausal Women Respected and ongoing research has looked at the most common causes of death, disability, and poor quality of life in postmenopausal women. The causes include heart disease, diseases of blood vessels, diabetes, depression, cancer, and bone loss (osteoporosis). Many things can be done to help lower the chances of developing these and other common problems. CARDIOVASCULAR DISEASE Heart Disease: A heart attack is a medical emergency. Know the signs and symptoms of a heart attack. Below are things women can do to reduce their risk for heart disease.   Do not smoke. If you smoke, quit.  Aim for a healthy weight. Being overweight causes many preventable deaths. Eat a healthy and balanced diet and drink an adequate amount of liquids.  Get moving. Make a commitment to be more physically active. Aim for 30 minutes of activity on most, if not all days of the week.  Eat for heart health. Choose a diet that is low in saturated fat and cholesterol and eliminate trans fat. Include whole grains, vegetables, and fruits. Read and understand the labels on food containers before buying.  Know your numbers. Ask your caregiver to check your blood pressure, cholesterol (total, HDL, LDL, triglycerides) and blood glucose. Work with your caregiver on improving your entire clinical picture.  High blood pressure. Limit or stop your table salt intake (try salt substitute and food seasonings). Avoid salty foods and drinks. Read labels on food containers before buying. Eating well and exercising can help control high blood pressure. STROKE  Stroke is a medical emergency. Stroke may be the result of a blood clot in a blood vessel in the brain or by a brain hemorrhage (bleeding). Know the signs and symptoms of a stroke. To lower the risk of developing a stroke:  Avoid fatty foods.  Quit smoking.  Control your diabetes, blood pressure, and irregular heart rate. THROMBOPHLEBITIS  (BLOOD CLOT) OF THE LEG  Becoming overweight and leading a stationary lifestyle may also contribute to developing blood clots. Controlling your diet and exercising will help lower the risk of developing blood clots. CANCER SCREENING  Breast Cancer: Take steps to reduce your risk of breast cancer.  You should practice "breast self-awareness." This means understanding the normal appearance and feel of your breasts and should include breast self-examination. Any changes detected, no matter how small, should be reported to your caregiver.  After age 40, you should have a clinical breast exam (CBE) every year.  Starting at age 40, you should consider having a mammogram (breast X-ray) every year.  If you have a family history of breast cancer, talk to your caregiver about genetic screening.  If you are at high risk for breast cancer, talk to your caregiver about having an MRI and a mammogram every year.  Intestinal or Stomach Cancer: Tests to consider are a rectal exam, fecal occult blood, sigmoidoscopy, and colonoscopy. Women who are high risk may need to be screened at an earlier age and more often.  Cervical Cancer:  Beginning at age 30, you should have a Pap test every 3 years as long as the past 3 Pap tests have been normal.  If you have had past treatment for cervical cancer or a condition that could lead to cancer, you need Pap tests and screening for cancer for at least 20 years after your treatment.  If you had a hysterectomy for a problem that was not cancer or a condition that could lead to cancer, then you no longer need Pap tests.    If you are between ages 65 and 70, and you have had normal Pap tests going back 10 years, you no longer need Pap tests.  If Pap tests have been discontinued, risk factors (such as a new sexual partner) need to be reassessed to determine if screening should be resumed.  Some medical problems can increase the chance of getting cervical cancer. In these  cases, your caregiver may recommend more frequent screening and Pap tests.  Uterine Cancer: If you have vaginal bleeding after reaching menopause, you should notify your caregiver.  Ovarian Cancer: Other than yearly pelvic exams, there are no reliable tests available to screen for ovarian cancer at this time except for yearly pelvic exams.  Lung Cancer: Yearly chest X-rays can detect lung cancer and should be done on high risk women, such as cigarette smokers and women with chronic lung disease (emphysema).  Skin Cancer: A complete body skin exam should be done at your yearly examination. Avoid overexposure to the sun and ultraviolet light lamps. Use a strong sun block cream when in the sun. All of these things are important for lowering the risk of skin cancer. MENOPAUSE Menopause Symptoms: Hormone therapy products are effective for treating symptoms associated with menopause:  Moderate to severe hot flashes.  Night sweats.  Mood swings.  Headaches.  Tiredness.  Loss of sex drive.  Insomnia.  Other symptoms. Hormone replacement carries certain risks, especially in older women. Women who use or are thinking about using estrogen or estrogen with progestin treatments should discuss that with their caregiver. Your caregiver will help you understand the benefits and risks. The ideal dose of hormone replacement therapy is not known. The Food and Drug Administration (FDA) has concluded that hormone therapy should be used only at the lowest doses and for the shortest amount of time to reach treatment goals.  OSTEOPOROSIS Protecting Against Bone Loss and Preventing Fracture If you use hormone therapy for prevention of bone loss (osteoporosis), the risks for bone loss must outweigh the risk of the therapy. Ask your caregiver about other medications known to be safe and effective for preventing bone loss and fractures. To guard against bone loss or fractures, the following is recommended:  If  you are younger than age 50, take 1000 mg of calcium and at least 600 mg of Vitamin D per day.  If you are older than age 50 but younger than age 70, take 1200 mg of calcium and at least 600 mg of Vitamin D per day.  If you are older than age 70, take 1200 mg of calcium and at least 800 mg of Vitamin D per day. Smoking and excessive alcohol intake increases the risk of osteoporosis. Eat foods rich in calcium and vitamin D and do weight bearing exercises several times a week as your caregiver suggests. DIABETES Diabetes Mellitus: If you have type I or type 2 diabetes, you should keep your blood sugar under control with diet, exercise, and recommended medication. Avoid starchy and fatty foods, and too many sweets. Being overweight can make diabetes control more difficult. COGNITION AND MEMORY Cognition and Memory: Menopausal hormone therapy is not recommended for the prevention of cognitive disorders such as Alzheimer's disease or memory loss.  DEPRESSION  Depression may occur at any age, but it is common in elderly women. This may be because of physical, medical, social (loneliness), or financial problems and needs. If you are experiencing depression because of medical problems and control of symptoms, talk to your caregiver about this. Physical   activity and exercise may help with mood and sleep. Community and volunteer involvement may improve your sense of value and worth. If you have depression and you feel that the problem is getting worse or becoming severe, talk to your caregiver about which treatment options are best for you. ACCIDENTS  Accidents are common and can be serious in elderly woman. Prepare your house to prevent accidents. Eliminate throw rugs, place hand bars in bath, shower, and toilet areas. Avoid wearing high heeled shoes or walking on wet, snowy, and icy areas. Limit or stop driving if you have vision or hearing problems, or if you feel you are unsteady with your movements and  reflexes. HEPATITIS C Hepatitis C is a type of viral infection affecting the liver. It is spread mainly through contact with blood from an infected person. It can be treated, but if left untreated, it can lead to severe liver damage over the years. Many people who are infected do not know that the virus is in their blood. If you are a "baby-boomer", it is recommended that you have one screening test for Hepatitis C. IMMUNIZATIONS  Several immunizations are important to consider having during your senior years, including:   Tetanus, diphtheria, and pertussis booster shot.  Influenza every year before the flu season begins.  Pneumonia vaccine.  Shingles vaccine.  Others, as indicated based on your specific needs. Talk to your caregiver about these. Document Released: 07/08/2005 Document Revised: 09/30/2013 Document Reviewed: 03/03/2008 ExitCare Patient Information 2015 [employer]. This information is not intended to replace advice given to you by your health care provider. Make sure you discuss any questions you have with your health care provider.  

## 2014-12-30 LAB — CYTOLOGY - PAP

## 2015-01-01 ENCOUNTER — Other Ambulatory Visit: Payer: Self-pay | Admitting: Women's Health

## 2015-01-01 ENCOUNTER — Ambulatory Visit (INDEPENDENT_AMBULATORY_CARE_PROVIDER_SITE_OTHER): Payer: BLUE CROSS/BLUE SHIELD

## 2015-01-01 DIAGNOSIS — M8580 Other specified disorders of bone density and structure, unspecified site: Secondary | ICD-10-CM | POA: Diagnosis not present

## 2015-01-01 DIAGNOSIS — Z13820 Encounter for screening for osteoporosis: Secondary | ICD-10-CM

## 2015-01-13 ENCOUNTER — Encounter: Payer: Self-pay | Admitting: Women's Health

## 2015-01-16 ENCOUNTER — Encounter: Payer: Self-pay | Admitting: Internal Medicine

## 2015-08-17 ENCOUNTER — Encounter: Payer: BLUE CROSS/BLUE SHIELD | Admitting: Internal Medicine

## 2015-09-25 ENCOUNTER — Encounter: Payer: Self-pay | Admitting: Internal Medicine

## 2015-10-07 ENCOUNTER — Encounter: Payer: Self-pay | Admitting: Internal Medicine

## 2015-10-07 ENCOUNTER — Ambulatory Visit (INDEPENDENT_AMBULATORY_CARE_PROVIDER_SITE_OTHER): Payer: BLUE CROSS/BLUE SHIELD | Admitting: Internal Medicine

## 2015-10-07 ENCOUNTER — Other Ambulatory Visit (INDEPENDENT_AMBULATORY_CARE_PROVIDER_SITE_OTHER): Payer: BLUE CROSS/BLUE SHIELD

## 2015-10-07 VITALS — BP 108/78 | HR 67 | Ht 64.0 in | Wt 143.0 lb

## 2015-10-07 DIAGNOSIS — E538 Deficiency of other specified B group vitamins: Secondary | ICD-10-CM

## 2015-10-07 DIAGNOSIS — Z0000 Encounter for general adult medical examination without abnormal findings: Secondary | ICD-10-CM

## 2015-10-07 DIAGNOSIS — S86912A Strain of unspecified muscle(s) and tendon(s) at lower leg level, left leg, initial encounter: Secondary | ICD-10-CM

## 2015-10-07 DIAGNOSIS — E559 Vitamin D deficiency, unspecified: Secondary | ICD-10-CM | POA: Diagnosis not present

## 2015-10-07 DIAGNOSIS — S76011A Strain of muscle, fascia and tendon of right hip, initial encounter: Secondary | ICD-10-CM | POA: Insufficient documentation

## 2015-10-07 DIAGNOSIS — S86919A Strain of unspecified muscle(s) and tendon(s) at lower leg level, unspecified leg, initial encounter: Secondary | ICD-10-CM | POA: Insufficient documentation

## 2015-10-07 DIAGNOSIS — Z91018 Allergy to other foods: Secondary | ICD-10-CM

## 2015-10-07 LAB — BASIC METABOLIC PANEL
BUN: 18 mg/dL (ref 6–23)
CO2: 29 mEq/L (ref 19–32)
CREATININE: 0.71 mg/dL (ref 0.40–1.20)
Calcium: 9.6 mg/dL (ref 8.4–10.5)
Chloride: 103 mEq/L (ref 96–112)
GFR: 89.81 mL/min (ref >60.00)
GLUCOSE: 97 mg/dL (ref 70–99)
POTASSIUM: 4.4 meq/L (ref 3.5–5.1)
Sodium: 139 mEq/L (ref 135–145)

## 2015-10-07 LAB — URINALYSIS, ROUTINE W REFLEX MICROSCOPIC
Bilirubin Urine: NEGATIVE
HGB URINE DIPSTICK: NEGATIVE
KETONES UR: NEGATIVE
NITRITE: NEGATIVE
RBC / HPF: NONE SEEN (ref 0–?)
Specific Gravity, Urine: 1.015 (ref 1.000–1.030)
Total Protein, Urine: NEGATIVE
URINE GLUCOSE: NEGATIVE
UROBILINOGEN UA: 0.2 (ref 0.0–1.0)
pH: 7 (ref 5.0–8.0)

## 2015-10-07 LAB — CBC WITH DIFFERENTIAL/PLATELET
BASOS PCT: 0.5 % (ref 0.0–3.0)
Basophils Absolute: 0 10*3/uL (ref 0.0–0.1)
EOS ABS: 0.3 10*3/uL (ref 0.0–0.7)
Eosinophils Relative: 6.3 % — ABNORMAL HIGH (ref 0.0–5.0)
HCT: 41 % (ref 36.0–46.0)
Hemoglobin: 14.1 g/dL (ref 12.0–15.0)
LYMPHS PCT: 26.4 % (ref 12.0–46.0)
Lymphs Abs: 1.4 10*3/uL (ref 0.7–4.0)
MCHC: 34.5 g/dL (ref 30.0–36.0)
MCV: 90.2 fl (ref 78.0–100.0)
MONO ABS: 0.4 10*3/uL (ref 0.1–1.0)
Monocytes Relative: 8.3 % (ref 3.0–12.0)
NEUTROS ABS: 3.1 10*3/uL (ref 1.4–7.7)
Neutrophils Relative %: 58.5 % (ref 43.0–77.0)
Platelets: 179 10*3/uL (ref 150.0–400.0)
RBC: 4.54 Mil/uL (ref 3.87–5.11)
RDW: 13 % (ref 11.5–15.5)
WBC: 5.3 10*3/uL (ref 4.0–10.5)

## 2015-10-07 LAB — LIPID PANEL
CHOLESTEROL: 203 mg/dL — AB (ref 0–200)
HDL: 71.1 mg/dL (ref >39.00)
LDL Cholesterol: 121 mg/dL — ABNORMAL HIGH (ref 0–99)
NonHDL: 132.01
TRIGLYCERIDES: 53 mg/dL (ref 0.0–149.0)
Total CHOL/HDL Ratio: 3
VLDL: 10.6 mg/dL (ref 0.0–40.0)

## 2015-10-07 LAB — HEPATIC FUNCTION PANEL
ALBUMIN: 4.5 g/dL (ref 3.5–5.2)
ALT: 18 U/L (ref 0–35)
AST: 18 U/L (ref 0–37)
Alkaline Phosphatase: 63 U/L (ref 39–117)
Bilirubin, Direct: 0.1 mg/dL (ref 0.0–0.3)
Total Bilirubin: 0.6 mg/dL (ref 0.2–1.2)
Total Protein: 7.4 g/dL (ref 6.0–8.3)

## 2015-10-07 LAB — VITAMIN B12: VITAMIN B 12: 284 pg/mL (ref 211–911)

## 2015-10-07 LAB — TSH: TSH: 2.26 u[IU]/mL (ref 0.35–4.50)

## 2015-10-07 LAB — VITAMIN D 25 HYDROXY (VIT D DEFICIENCY, FRACTURES): VITD: 31.16 ng/mL (ref 30.00–100.00)

## 2015-10-07 MED ORDER — CYANOCOBALAMIN 1000 MCG/ML IJ SOLN: 1000.0000 ug | INTRAMUSCULAR | Status: DC

## 2015-10-07 MED ORDER — CYCLOBENZAPRINE HCL 5 MG PO TABS
5.0000 mg | ORAL_TABLET | Freq: Two times a day (BID) | ORAL | Status: DC | PRN
Start: 2015-10-07 — End: 2016-10-07

## 2015-10-07 MED ORDER — "SYRINGE/NEEDLE (DISP) 30G X 1/2"" 1 ML MISC": 1.0000 | Status: DC

## 2015-10-07 MED ORDER — ZOLPIDEM TARTRATE 5 MG PO TABS: 5.0000 mg | ORAL_TABLET | Freq: Every evening | ORAL | Status: DC | PRN

## 2015-10-07 MED ORDER — PREDNISONE 10 MG PO TABS: ORAL_TABLET | ORAL | Status: DC

## 2015-10-07 NOTE — Progress Notes (Signed)
Subjective:  Patient ID: [name], female    DOB: 02/05/1956  Age: 60 y.o. MRN: [id number]  CC: Annual Exam   HPI [name] presents for well exam  Outpatient Prescriptions Prior to Visit  Medication Sig Dispense Refill  . Cholecalciferol (EQL VITAMIN D3) 1000 UNITS tablet Take 1 tablet (1,000 Units total) by mouth daily. 2 tabs po daily 100 tablet 3  . cyanocobalamin (,VITAMIN B-12,) 1000 MCG/ML injection Inject 1 mL (1,000 mcg total) into the muscle every 30 (thirty) days. 10 mL 6  . EPINEPHrine 0.3 mg/0.3 mL IJ SOAJ injection Inject 0.3 mLs (0.3 mg total) into the muscle once. 1 Device 0  . NON FORMULARY PROBIOTIC PO  1 TAB DAILY    . omeprazole (PRILOSEC) 20 MG capsule Take 1 capsule (20 mg total) by mouth daily. (Patient taking differently: Take 20 mg by mouth as needed. ) 100 capsule 3  . zolpidem (AMBIEN) 5 MG tablet Take 1 tablet (5 mg total) by mouth at bedtime as needed. for sleep 30 tablet 1  . amitriptyline (ELAVIL) 10 MG tablet Take 1 tablet (10 mg total) by mouth at bedtime. (Patient not taking: Reported on 10/07/2015) 90 tablet 3   No facility-administered medications prior to visit.    ROS Review of Systems  Constitutional: Negative for chills, activity change, appetite change, fatigue and unexpected weight change.  HENT: Negative for congestion, mouth sores and sinus pressure.   Eyes: Negative for visual disturbance.  Respiratory: Negative for cough and chest tightness.   Gastrointestinal: Negative for nausea and abdominal pain.  Genitourinary: Negative for frequency, difficulty urinating and vaginal pain.  Musculoskeletal: Negative for back pain and gait problem.  Skin: Negative for pallor and rash.  Neurological: Negative for dizziness, tremors, weakness, numbness and headaches.  Psychiatric/Behavioral: Negative for suicidal ideas, confusion and sleep disturbance.    Objective:  BP 108/78 mmHg  Pulse 67  Ht 5\' 4"  (1.626 m)  Wt 143 lb (64.864  kg)  BMI 24.53 kg/m2  SpO2 98%  BP Readings from Last 3 Encounters:  10/07/15 108/78  12/26/14 110/78  11/28/14 124/68    Wt Readings from Last 3 Encounters:  10/07/15 143 lb (64.864 kg)  12/26/14 141 lb (63.957 kg)  11/28/14 140 lb (63.504 kg)    Physical Exam  Constitutional: She appears well-developed. No distress.  HENT:  Head: Normocephalic.  Right Ear: External ear normal.  Left Ear: External ear normal.  Nose: Nose normal.  Mouth/Throat: Oropharynx is clear and moist.  Eyes: Conjunctivae are normal. Pupils are equal, round, and reactive to light. Right eye exhibits no discharge. Left eye exhibits no discharge.  Neck: Normal range of motion. Neck supple. No JVD present. No tracheal deviation present. No thyromegaly present.  Cardiovascular: Normal rate, regular rhythm and normal heart sounds.   Pulmonary/Chest: No stridor. No respiratory distress. She has no wheezes.  Abdominal: Soft. Bowel sounds are normal. She exhibits no distension and no mass. There is no tenderness. There is no rebound and no guarding.  Musculoskeletal: She exhibits no edema or tenderness.  Lymphadenopathy:    She has no cervical adenopathy.  Neurological: She displays normal reflexes. No cranial nerve deficit. She exhibits normal muscle tone. Coordination normal.  Skin: No rash noted. No erythema.  Psychiatric: She has a normal mood and affect. Her behavior is normal. Judgment and thought content normal.    Lab Results  Component Value Date   WBC 5.4 10/21/2014   HGB 13.6 10/21/2014  HCT 39.4 10/21/2014   PLT 170.0 10/21/2014   GLUCOSE 84 10/21/2014   CHOL 185 10/21/2014   TRIG 60.0 10/21/2014   HDL 66.00 10/21/2014   LDLCALC 107* 10/21/2014   ALT 20 10/21/2014   AST 27 10/21/2014   NA 138 10/21/2014   K 3.7 10/21/2014   CL 102 10/21/2014   CREATININE 0.67 10/21/2014   BUN 10 10/21/2014   CO2 30 10/21/2014   TSH 2.66 10/21/2014    No results found.  Assessment & Plan:    There are no diagnoses linked to this encounter. I am having [name] maintain her amitriptyline, Cholecalciferol, cyanocobalamin, EPINEPHrine, omeprazole, zolpidem, and NON FORMULARY.  No orders of the defined types were placed in this encounter.     Follow-up: No Follow-up on file.  Walker Kehr, MD

## 2015-10-07 NOTE — Assessment & Plan Note (Signed)
On Vit D 

## 2015-10-07 NOTE — Assessment & Plan Note (Signed)
Labs Not taking shots

## 2015-10-07 NOTE — Assessment & Plan Note (Addendum)
We discussed age appropriate health related issues, including available/recomended screening tests and vaccinations. We discussed a need for adhering to healthy diet and exercise. Labs/EKG were reviewed/ordered. All questions were answered. She has a GYN in [place]. Ophth in [place]. Colon up to date. Colon due 2019  

## 2015-10-07 NOTE — Progress Notes (Signed)
Pre visit review using our clinic review tool, if applicable. No additional management support is needed unless otherwise documented below in the visit note. 

## 2015-10-07 NOTE — Assessment & Plan Note (Signed)
Severe intestinal allergy to tuna, salmon Benadryl, Prednisone prn 

## 2015-10-08 LAB — HEPATITIS C ANTIBODY: HCV Ab: NEGATIVE

## 2015-12-14 ENCOUNTER — Other Ambulatory Visit (INDEPENDENT_AMBULATORY_CARE_PROVIDER_SITE_OTHER): Payer: BLUE CROSS/BLUE SHIELD

## 2015-12-14 ENCOUNTER — Telehealth: Payer: Self-pay

## 2015-12-14 DIAGNOSIS — N3000 Acute cystitis without hematuria: Secondary | ICD-10-CM

## 2015-12-14 LAB — URINALYSIS, ROUTINE W REFLEX MICROSCOPIC
BILIRUBIN URINE: NEGATIVE
Ketones, ur: NEGATIVE
Nitrite: NEGATIVE
PH: 7.5 (ref 5.0–8.0)
Specific Gravity, Urine: 1.01 (ref 1.000–1.030)
Urine Glucose: NEGATIVE
Urobilinogen, UA: 0.2 (ref 0.0–1.0)

## 2015-12-14 MED ORDER — CIPROFLOXACIN HCL 250 MG PO TABS: 250.0000 mg | ORAL_TABLET | Freq: Two times a day (BID) | ORAL | Status: DC

## 2015-12-14 NOTE — Telephone Encounter (Signed)
Notified pt MD ok UA order has been place...[name]

## 2015-12-14 NOTE — Telephone Encounter (Signed)
Patient called and said she is going out of town today. And she believes she has a UTI. She wanted to know if she could come into the lab and leave a sample. Please advise or follow up, Thank you.

## 2015-12-14 NOTE — Telephone Encounter (Signed)
Ok Thx 

## 2015-12-16 ENCOUNTER — Encounter: Payer: Self-pay | Admitting: Internal Medicine

## 2016-04-19 ENCOUNTER — Ambulatory Visit (INDEPENDENT_AMBULATORY_CARE_PROVIDER_SITE_OTHER): Payer: BLUE CROSS/BLUE SHIELD | Admitting: Women's Health

## 2016-04-19 ENCOUNTER — Encounter: Payer: Self-pay | Admitting: Women's Health

## 2016-04-19 VITALS — BP 110/80 | Ht 64.0 in | Wt 149.0 lb

## 2016-04-19 DIAGNOSIS — Z01419 Encounter for gynecological examination (general) (routine) without abnormal findings: Secondary | ICD-10-CM

## 2016-04-19 NOTE — Patient Instructions (Signed)

## 2016-04-19 NOTE — Progress Notes (Signed)
[name] 1955-06-17 [id number]    History:    Presents for annual exam.  Postmenopausal on no HRT with no bleeding. Same partner, planning marriage in December. Normal Pap and mammogram history. 2009 negative colonoscopy. 2016 DEXA -1.4 femoral neck FRAX 7%/0.5%. Labs at primary care. Vitamin D 31 and primary care.  Past medical history, past surgical history, family history and social history were all reviewed and documented in the EPIC chart. 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 Originally from [place].  ROS:  A ROS was performed and pertinent positives and negatives are included.  Exam:  Vitals:   04/19/16 0914  Weight: 149 lb (67.6 kg)  Height: 5\' 4"  (1.626 m)   Body mass index is 25.58 kg/m.   General appearance:  Normal Thyroid:  Symmetrical, normal in size, without palpable masses or nodularity. Respiratory  Auscultation:  Clear without wheezing or rhonchi Cardiovascular  Auscultation:  Regular rate, without rubs, murmurs or gallops  Edema/varicosities:  Not grossly evident Abdominal  Soft,nontender, without masses, guarding or rebound.  Liver/spleen:  No organomegaly noted  Hernia:  None appreciated  Skin  Inspection:  Grossly normal   Breasts: Examined lying and sitting.     Right: Without masses, retractions, discharge or axillary adenopathy.     Left: Without masses, retractions, discharge or axillary adenopathy. Gentitourinary   Inguinal/mons:  Normal without inguinal adenopathy  External genitalia:  Normal  BUS/Urethra/Skene's glands:  Normal  Vagina:  Normal, mild atrophy  Cervix:  Normal  Uterus:   normal in size, shape and contour.  Midline and mobile  Adnexa/parametria:     Rt: Without masses or tenderness.   Lt: Without masses or tenderness.  Anus and perineum: Normal  Digital rectal exam: Normal sphincter tone without palpated masses or tenderness  Assessment/Plan:  60 y.o. engaged WF G1 P1  for annual exam with occasional rectal itching.  Postmenopausal/no HRT/no bleeding Osteopenia without elevated FRAX Labs-primary care  Plan: States has had some rectal itching on occasion, none now. Will watch at this time if becomes bothersome instructed to return to office. Reviewed vitamin D level low at primary care 31, take 4000 IUs daily. SBE's, continue annual screening mammogram overdue instructed to schedule. Breast center information given. Continue healthy lifestyle of regular exercise and diet. Currently working with a Clinical research associate. Safety, fall prevention and importance of continuing weightbearing exercise reviewed. UA, Pap normal with negative HR HPV 2016, new screening guidelines reviewed.    Huel Cote [facility], 9:16 AM 04/19/2016

## 2016-04-20 ENCOUNTER — Other Ambulatory Visit: Payer: Self-pay | Admitting: Internal Medicine

## 2016-04-20 DIAGNOSIS — Z1231 Encounter for screening mammogram for malignant neoplasm of breast: Secondary | ICD-10-CM

## 2016-04-20 LAB — URINALYSIS W MICROSCOPIC + REFLEX CULTURE
BACTERIA UA: NONE SEEN [HPF]
BILIRUBIN URINE: NEGATIVE
CRYSTALS: NONE SEEN [HPF]
Casts: NONE SEEN [LPF]
Glucose, UA: NEGATIVE
HGB URINE DIPSTICK: NEGATIVE
KETONES UR: NEGATIVE
Nitrite: NEGATIVE
PROTEIN: NEGATIVE
RBC / HPF: NONE SEEN RBC/HPF (ref <=2)
SQUAMOUS EPITHELIAL / LPF: NONE SEEN [HPF] (ref <=5)
Specific Gravity, Urine: 1.008 (ref 1.001–1.035)
Yeast: NONE SEEN [HPF]
pH: 7 (ref 5.0–8.0)

## 2016-04-21 LAB — URINE CULTURE

## 2016-05-13 ENCOUNTER — Ambulatory Visit
Admission: RE | Admit: 2016-05-13 | Discharge: 2016-05-13 | Disposition: A | Discharge status: Home / Self Care | Payer: BLUE CROSS/BLUE SHIELD | Source: Ambulatory Visit | Attending: Internal Medicine | Admitting: Internal Medicine

## 2016-05-13 DIAGNOSIS — Z1231 Encounter for screening mammogram for malignant neoplasm of breast: Secondary | ICD-10-CM

## 2016-05-17 ENCOUNTER — Encounter: Payer: Self-pay | Admitting: Women's Health

## 2016-10-07 ENCOUNTER — Encounter: Payer: Self-pay | Admitting: Internal Medicine

## 2016-10-07 ENCOUNTER — Ambulatory Visit (INDEPENDENT_AMBULATORY_CARE_PROVIDER_SITE_OTHER): Payer: BLUE CROSS/BLUE SHIELD | Admitting: Internal Medicine

## 2016-10-07 VITALS — BP 110/76 | HR 77 | Temp 98.0°F | Ht 64.0 in | Wt 150.0 lb

## 2016-10-07 DIAGNOSIS — E559 Vitamin D deficiency, unspecified: Secondary | ICD-10-CM

## 2016-10-07 DIAGNOSIS — F5104 Psychophysiologic insomnia: Secondary | ICD-10-CM

## 2016-10-07 DIAGNOSIS — Z0000 Encounter for general adult medical examination without abnormal findings: Secondary | ICD-10-CM | POA: Diagnosis not present

## 2016-10-07 DIAGNOSIS — E538 Deficiency of other specified B group vitamins: Secondary | ICD-10-CM

## 2016-10-07 DIAGNOSIS — G4700 Insomnia, unspecified: Secondary | ICD-10-CM | POA: Insufficient documentation

## 2016-10-07 MED ORDER — CYANOCOBALAMIN 1000 MCG/ML IJ SOLN: 1000.0000 ug | INTRAMUSCULAR | 6 refills | Status: DC

## 2016-10-07 MED ORDER — "SYRINGE/NEEDLE (DISP) 30G X 1/2"" 1 ML MISC": 1.0000 | 5 refills | Status: DC

## 2016-10-07 MED ORDER — ZOLPIDEM TARTRATE 5 MG PO TABS: 5.0000 mg | ORAL_TABLET | Freq: Every evening | ORAL | 2 refills | Status: DC | PRN

## 2016-10-07 NOTE — Assessment & Plan Note (Signed)
Vit B12 

## 2016-10-07 NOTE — Assessment & Plan Note (Signed)
On Vit D 

## 2016-10-07 NOTE — Assessment & Plan Note (Signed)
We discussed age appropriate health related issues, including available/recomended screening tests and vaccinations. We discussed a need for adhering to healthy diet and exercise. Labs/EKG were reviewed/ordered. All questions were answered. She has a GYN in [place]. Ophth in [place]. Colon up to date. Colon due 2019

## 2016-10-07 NOTE — Progress Notes (Signed)
Subjective:  Patient ID: [name], female    DOB: 10/14/1955  Age: 61 y.o. MRN: [id number]  CC: No chief complaint on file.   HPI [name] presents for a well exam  Outpatient Medications Prior to Visit  Medication Sig Dispense Refill  . Cholecalciferol (EQL VITAMIN D3) 1000 UNITS tablet Take 1 tablet (1,000 Units total) by mouth daily. 2 tabs po daily 100 tablet 3  . cyanocobalamin (,VITAMIN B-12,) 1000 MCG/ML injection Inject 1 mL (1,000 mcg total) into the muscle every 30 (thirty) days. 10 mL 6  . EPINEPHrine 0.3 mg/0.3 mL IJ SOAJ injection Inject 0.3 mLs (0.3 mg total) into the muscle once. 1 Device 0  . NON FORMULARY PROBIOTIC PO  1 TAB DAILY    . Syringe/Needle, Disp, (B-D ECLIPSE SYRINGE) 30G X 1/2" 1 ML MISC 1 each by Does not apply route 1 day or 1 dose. 50 each 5  . zolpidem (AMBIEN) 5 MG tablet Take 1 tablet (5 mg total) by mouth at bedtime as needed. for sleep 30 tablet 2  . cyclobenzaprine (FLEXERIL) 5 MG tablet Take 1 tablet (5 mg total) by mouth 2 (two) times daily as needed for muscle spasms. 30 tablet 0   No facility-administered medications prior to visit.     ROS Review of Systems  Constitutional: Negative for activity change, appetite change, chills, fatigue and unexpected weight change.  HENT: Negative for congestion, mouth sores and sinus pressure.   Eyes: Negative for visual disturbance.  Respiratory: Negative for cough and chest tightness.   Gastrointestinal: Negative for abdominal pain and nausea.  Genitourinary: Negative for difficulty urinating, frequency and vaginal pain.  Musculoskeletal: Negative for back pain and gait problem.  Skin: Negative for pallor and rash.  Neurological: Negative for dizziness, tremors, weakness, numbness and headaches.  Psychiatric/Behavioral: Negative for confusion and sleep disturbance.    Objective:  BP 110/76 (BP Location: Left Arm, Patient Position: Sitting, Cuff Size: Normal)   Pulse 77   Temp 98 F  (36.7 C) (Oral)   Ht 5\' 4"  (1.626 m)   Wt 150 lb (68 kg)   SpO2 100%   BMI 25.75 kg/m   BP Readings from Last 3 Encounters:  10/07/16 110/76  04/19/16 110/80  10/07/15 108/78    Wt Readings from Last 3 Encounters:  10/07/16 150 lb (68 kg)  04/19/16 149 lb (67.6 kg)  10/07/15 143 lb (64.9 kg)    Physical Exam  Constitutional: She appears well-developed. No distress.  HENT:  Head: Normocephalic.  Right Ear: External ear normal.  Left Ear: External ear normal.  Nose: Nose normal.  Mouth/Throat: Oropharynx is clear and moist.  Eyes: Conjunctivae are normal. Pupils are equal, round, and reactive to light. Right eye exhibits no discharge. Left eye exhibits no discharge.  Neck: Normal range of motion. Neck supple. No JVD present. No tracheal deviation present. No thyromegaly present.  Cardiovascular: Normal rate, regular rhythm and normal heart sounds.   Pulmonary/Chest: No stridor. No respiratory distress. She has no wheezes.  Abdominal: Soft. Bowel sounds are normal. She exhibits no distension and no mass. There is no tenderness. There is no rebound and no guarding.  Musculoskeletal: She exhibits no edema or tenderness.  Lymphadenopathy:    She has no cervical adenopathy.  Neurological: She displays normal reflexes. No cranial nerve deficit. She exhibits normal muscle tone. Coordination normal.  Skin: No rash noted. No erythema.  Psychiatric: She has a normal mood and affect. Her behavior is normal. Judgment  and thought content normal.    Lab Results  Component Value Date   WBC 5.3 10/07/2015   HGB 14.1 10/07/2015   HCT 41.0 10/07/2015   PLT 179.0 10/07/2015   GLUCOSE 97 10/07/2015   CHOL 203 (H) 10/07/2015   TRIG 53.0 10/07/2015   HDL 71.10 10/07/2015   LDLCALC 121 (H) 10/07/2015   ALT 18 10/07/2015   AST 18 10/07/2015   NA 139 10/07/2015   K 4.4 10/07/2015   CL 103 10/07/2015   CREATININE 0.71 10/07/2015   BUN 18 10/07/2015   CO2 29 10/07/2015   TSH 2.26  10/07/2015    Mm Digital Screening Bilateral  Result Date: 05/13/2016 CLINICAL DATA:  Screening. EXAM: DIGITAL SCREENING BILATERAL MAMMOGRAM WITH CAD COMPARISON:  Previous exam(s). ACR Breast Density Category b: There are scattered areas of fibroglandular density. FINDINGS: There are no findings suspicious for malignancy. Images were processed with CAD. IMPRESSION: No mammographic evidence of malignancy. A result letter of this screening mammogram will be mailed directly to the patient. RECOMMENDATION: Screening mammogram in one year. (Code:SM-B-01Y) BI-RADS CATEGORY  1: Negative. Electronically Signed   By: Lajean Manes M.D.   On: 05/16/2016 16:57    Assessment & Plan:   There are no diagnoses linked to this encounter. I have discontinued Ms. Kass's cyclobenzaprine. I am also having her maintain her Cholecalciferol, EPINEPHrine, NON FORMULARY, zolpidem, cyanocobalamin, and Syringe/Needle (Disp).  No orders of the defined types were placed in this encounter.    Follow-up: No Follow-up on file.  Walker Kehr, MD

## 2016-10-07 NOTE — Assessment & Plan Note (Signed)
Zolpidem - rare  Potential benefits of occasional benzodiazepines  use as well as potential risks  and complications were explained to the patient and were aknowledged.

## 2016-10-10 ENCOUNTER — Other Ambulatory Visit: Payer: Self-pay | Admitting: Internal Medicine

## 2016-10-10 DIAGNOSIS — E559 Vitamin D deficiency, unspecified: Secondary | ICD-10-CM

## 2016-10-12 ENCOUNTER — Encounter: Payer: Self-pay | Admitting: Gynecology

## 2016-10-12 ENCOUNTER — Other Ambulatory Visit (INDEPENDENT_AMBULATORY_CARE_PROVIDER_SITE_OTHER): Payer: BLUE CROSS/BLUE SHIELD

## 2016-10-12 DIAGNOSIS — E538 Deficiency of other specified B group vitamins: Secondary | ICD-10-CM | POA: Diagnosis not present

## 2016-10-12 DIAGNOSIS — Z0000 Encounter for general adult medical examination without abnormal findings: Secondary | ICD-10-CM | POA: Diagnosis not present

## 2016-10-12 LAB — URINALYSIS
Bilirubin Urine: NEGATIVE
Hgb urine dipstick: NEGATIVE
Ketones, ur: NEGATIVE
LEUKOCYTES UA: NEGATIVE
Nitrite: NEGATIVE
PH: 7 (ref 5.0–8.0)
SPECIFIC GRAVITY, URINE: 1.015 (ref 1.000–1.030)
Total Protein, Urine: NEGATIVE
URINE GLUCOSE: NEGATIVE
UROBILINOGEN UA: 0.2 (ref 0.0–1.0)

## 2016-10-12 LAB — CBC WITH DIFFERENTIAL/PLATELET
BASOS PCT: 0.9 % (ref 0.0–3.0)
Basophils Absolute: 0.1 10*3/uL (ref 0.0–0.1)
EOS ABS: 0.3 10*3/uL (ref 0.0–0.7)
Eosinophils Relative: 6.1 % — ABNORMAL HIGH (ref 0.0–5.0)
HEMATOCRIT: 38.6 % (ref 36.0–46.0)
HEMOGLOBIN: 13.1 g/dL (ref 12.0–15.0)
LYMPHS PCT: 40.6 % (ref 12.0–46.0)
Lymphs Abs: 2.3 10*3/uL (ref 0.7–4.0)
MCHC: 34 g/dL (ref 30.0–36.0)
MCV: 91.5 fl (ref 78.0–100.0)
MONOS PCT: 6.6 % (ref 3.0–12.0)
Monocytes Absolute: 0.4 10*3/uL (ref 0.1–1.0)
Neutro Abs: 2.5 10*3/uL (ref 1.4–7.7)
Neutrophils Relative %: 45.8 % (ref 43.0–77.0)
Platelets: 170 10*3/uL (ref 150.0–400.0)
RBC: 4.22 Mil/uL (ref 3.87–5.11)
RDW: 12.7 % (ref 11.5–15.5)
WBC: 5.6 10*3/uL (ref 4.0–10.5)

## 2016-10-12 LAB — HEPATIC FUNCTION PANEL
ALT: 17 U/L (ref 0–35)
AST: 16 U/L (ref 0–37)
Albumin: 4.1 g/dL (ref 3.5–5.2)
Alkaline Phosphatase: 56 U/L (ref 39–117)
Bilirubin, Direct: 0.1 mg/dL (ref 0.0–0.3)
TOTAL PROTEIN: 6.6 g/dL (ref 6.0–8.3)
Total Bilirubin: 0.6 mg/dL (ref 0.2–1.2)

## 2016-10-12 LAB — VITAMIN D 25 HYDROXY (VIT D DEFICIENCY, FRACTURES): VITD: 25.9 ng/mL — AB (ref 30.00–100.00)

## 2016-10-12 LAB — LIPID PANEL
CHOL/HDL RATIO: 3
Cholesterol: 195 mg/dL (ref 0–200)
HDL: 60.7 mg/dL (ref >39.00)
LDL Cholesterol: 124 mg/dL — ABNORMAL HIGH (ref 0–99)
NonHDL: 134.6
TRIGLYCERIDES: 52 mg/dL (ref 0.0–149.0)
VLDL: 10.4 mg/dL (ref 0.0–40.0)

## 2016-10-12 LAB — BASIC METABOLIC PANEL
BUN: 13 mg/dL (ref 6–23)
CHLORIDE: 104 meq/L (ref 96–112)
CO2: 31 mEq/L (ref 19–32)
CREATININE: 0.65 mg/dL (ref 0.40–1.20)
Calcium: 9.2 mg/dL (ref 8.4–10.5)
GFR: 99.1 mL/min (ref >60.00)
GLUCOSE: 88 mg/dL (ref 70–99)
POTASSIUM: 4 meq/L (ref 3.5–5.1)
Sodium: 140 mEq/L (ref 135–145)

## 2016-10-12 LAB — TSH: TSH: 3.25 u[IU]/mL (ref 0.35–4.50)

## 2016-10-12 LAB — VITAMIN B12: VITAMIN B 12: 465 pg/mL (ref 211–911)

## 2016-10-14 MED ORDER — VITAMIN D3 1.25 MG (50000 UT) PO CAPS: 1.0000 | ORAL_CAPSULE | ORAL | 0 refills | Status: DC

## 2016-11-22 ENCOUNTER — Other Ambulatory Visit: Payer: Self-pay | Admitting: Internal Medicine

## 2017-03-22 ENCOUNTER — Encounter: Payer: Self-pay | Admitting: Gastroenterology

## 2017-05-15 ENCOUNTER — Encounter: Payer: Self-pay | Admitting: Gastroenterology

## 2017-05-15 ENCOUNTER — Ambulatory Visit (INDEPENDENT_AMBULATORY_CARE_PROVIDER_SITE_OTHER): Payer: BLUE CROSS/BLUE SHIELD | Admitting: Gastroenterology

## 2017-05-15 ENCOUNTER — Other Ambulatory Visit: Payer: BLUE CROSS/BLUE SHIELD

## 2017-05-15 VITALS — BP 102/70 | HR 78 | Ht 64.57 in | Wt 148.0 lb

## 2017-05-15 DIAGNOSIS — R140 Abdominal distension (gaseous): Secondary | ICD-10-CM

## 2017-05-15 DIAGNOSIS — Z1211 Encounter for screening for malignant neoplasm of colon: Secondary | ICD-10-CM

## 2017-05-15 DIAGNOSIS — R12 Heartburn: Secondary | ICD-10-CM | POA: Diagnosis not present

## 2017-05-15 DIAGNOSIS — Z800 Family history of malignant neoplasm of digestive organs: Secondary | ICD-10-CM | POA: Diagnosis not present

## 2017-05-15 DIAGNOSIS — K219 Gastro-esophageal reflux disease without esophagitis: Secondary | ICD-10-CM

## 2017-05-15 NOTE — Patient Instructions (Signed)
Go to the basement for labs today  It has been recommended to you by your physician that you have a(n) Colonoscopy/endoscopy completed. Per your request, we did not schedule the procedure(s) today. Please contact our office at [phone] should you decide to have the procedure completed.  We will contact you when March's appointment schedule is out   Follow up as needed  If you are age 59 or older, your body mass index should be between 23-30. Your Body mass index is 24.96 kg/m. If this is out of the aforementioned range listed, please consider follow up with your Primary Care Provider.  If you are age 29 or younger, your body mass index should be between 19-25. Your Body mass index is 24.96 kg/m. If this is out of the aformentioned range listed, please consider follow up with your Primary Care Provider.

## 2017-05-15 NOTE — Progress Notes (Signed)
ONEY Braun    [id number]    04-29-1956  Primary Care Physician:[name], Kayla Lacks, MD  Referring Physician: Cassandria Anger, MD [place], [place] [address]  Chief complaint: Acid reflux, belching, bloating with excessive gas  HPI: 61 year old here to establish care.  She was previously followed by Dr. Olevia Braun, last seen in July 2009.  She underwent EGD and colonoscopy at the time. EGD  was normal. H pylori IgG antibody negative in 2009.Colonoscopy July 2009 was normal. She started having severe heartburn a few months ago, took omeprazole and ranitidine a few weeks with improvement of symptoms, currently taking them only as needed.  Denies any dysphagia, odynophagia, abdominal pain, nausea, vomiting, constipation, diarrhea or blood per rectum. Mother diagnosed with colon cancer at age 61  Outpatient Encounter Medications as of 05/15/2017  Medication Sig  . Cholecalciferol (EQL VITAMIN D3) 1000 UNITS tablet Take 1 tablet (1,000 Units total) by mouth daily. 2 tabs po daily  . cyanocobalamin (,VITAMIN B-12,) 1000 MCG/ML injection Inject 1 mL (1,000 mcg total) into the muscle every 30 (thirty) days.  [name] EPINEPHrine 0.3 mg/0.3 mL IJ SOAJ injection Inject 0.3 mLs (0.3 mg total) into the muscle once.  . Syringe/Needle, Disp, (B-D ECLIPSE SYRINGE) 30G X 1/2" 1 ML MISC 1 each by Does not apply route 1 day or 1 dose.  . zolpidem (AMBIEN) 5 MG tablet Take 1 tablet (5 mg total) by mouth at bedtime as needed. for sleep  . [DISCONTINUED] D3-50 50000 units capsule TAKE 1 CAPSULE BY MOUTH ONCE A WEEK.  . [DISCONTINUED] NON FORMULARY PROBIOTIC PO  1 TAB DAILY   No facility-administered encounter medications on file as of 05/15/2017.     Allergies as of 05/15/2017 - Review Complete 10/07/2016  Allergen Reaction Noted  . Penicillins  09/21/2007    Past Medical History:  Diagnosis Date  . Allergy    GI upset from allergies to fish etc  . COPD (chronic obstructive  pulmonary disease) ([place])   . Hereditary angioedema ([facility])    high IgE levels  . Hives    history of- cold exposure related    Past Surgical History:  Procedure Laterality Date  . EXPLORATORY LAPAROTOMY      Family History  Problem Relation Age of Onset  . Parkinsonism Mother   . Cancer Mother        pancreatic  . Allergy (severe) Father        hives  . Allergy (severe) Daughter        hives  . Angioedema Daughter        seen by immunologist at [facility] History   Socioeconomic History  . Marital status: Married    Spouse name: Not on file  . Number of children: Not on file  . Years of education: Not on file  . Highest education level: Not on file  Social Needs  . Financial resource strain: Not on file  . Food insecurity - worry: Not on file  . Food insecurity - inability: Not on file  . Transportation needs - medical: Not on file  . Transportation needs - non-medical: Not on file  Occupational History  . Not on file  Tobacco Use  . Smoking status: Never Smoker  . Smokeless tobacco: Never Used  Substance and Sexual Activity  . Alcohol use: Yes    Alcohol/week: 0.0 oz  . Drug use: No  . Sexual  activity: Yes    Comment: INTERCOURSE AGE 26, SEXUAL PARTNERS LESS THAN 5  Other Topics Concern  . Not on file  Social History Narrative  . Not on file      Review of systems: Review of Systems  Constitutional: Negative for fever and chills.  HENT: Negative.   Eyes: Negative for blurred vision.  Respiratory: Negative for cough, shortness of breath and wheezing.   Cardiovascular: Negative for chest pain and palpitations.  Gastrointestinal: as per HPI Genitourinary: Negative for dysuria, urgency, frequency and hematuria.  Musculoskeletal: Negative for myalgias, back pain and joint pain.  Skin: Negative for itching and rash.  Neurological: Negative for dizziness, tremors, focal weakness, seizures and loss of consciousness.  Endo/Heme/Allergies: Positive for  seasonal allergies.  Psychiatric/Behavioral: Negative for depression, suicidal ideas and hallucinations.  All other systems reviewed and are negative.   Physical Exam: Vitals:   05/15/17 1333  BP: 102/70  Pulse: 78   Body mass index is 24.96 kg/m. Gen:      No acute distress HEENT:  EOMI, sclera anicteric Neck:     No masses; no thyromegaly Lungs:    Clear to auscultation bilaterally; normal respiratory effort CV:         Regular rate and rhythm; no murmurs Abd:      + bowel sounds; soft, non-tender; no palpable masses, no distension Ext:    No edema; adequate peripheral perfusion Skin:      Warm and dry; no rash Neuro: alert and oriented x 3 Psych: normal mood and affect  Data Reviewed:  Reviewed labs, radiology imaging, old records and pertinent past GI work up   Assessment and Plan/Recommendations:  61 year old female with family history of colon cancer in her mother at age 61, here to establish care Recent exacerbation of heartburn, will schedule for EGD to exclude esophagitis The risks and benefits as well as alternatives of endoscopic procedure(s) have been discussed and reviewed. All questions answered. The patient agrees to proceed. Continue omeprazole 20 mg daily Ranitidine at bedtime as needed Discussed antireflux measures and lifestyle modification in detail  Patient is also due for screening colonoscopy as last colonoscopy was in 2009 Will plan to do colonoscopy along with EGD  Return as needed after the procedures  K. Denzil Magnuson , MD [phone] Mon-Fri 8a-5p [phone] after 5p, weekends, holidays  CC: [name], Kayla Lacks, MD

## 2017-05-17 ENCOUNTER — Ambulatory Visit (INDEPENDENT_AMBULATORY_CARE_PROVIDER_SITE_OTHER): Payer: BLUE CROSS/BLUE SHIELD | Admitting: Women's Health

## 2017-05-17 ENCOUNTER — Encounter: Payer: Self-pay | Admitting: Women's Health

## 2017-05-17 VITALS — BP 120/82 | Ht 64.0 in | Wt 146.0 lb

## 2017-05-17 DIAGNOSIS — Z01419 Encounter for gynecological examination (general) (routine) without abnormal findings: Secondary | ICD-10-CM

## 2017-05-17 DIAGNOSIS — Z13820 Encounter for screening for osteoporosis: Secondary | ICD-10-CM

## 2017-05-17 NOTE — Patient Instructions (Signed)
Health Maintenance for Postmenopausal Women Menopause is a normal process in which your reproductive ability comes to an end. This process happens gradually over a span of months to years, usually between the ages of 22 and 9. Menopause is complete when you have missed 12 consecutive menstrual periods. It is important to talk with your health care provider about some of the most common conditions that affect postmenopausal women, such as heart disease, cancer, and bone loss (osteoporosis). Adopting a healthy lifestyle and getting preventive care can help to promote your health and wellness. Those actions can also lower your chances of developing some of these common conditions. What should I know about menopause? During menopause, you may experience a number of symptoms, such as:  Moderate-to-severe hot flashes.  Night sweats.  Decrease in sex drive.  Mood swings.  Headaches.  Tiredness.  Irritability.  Memory problems.  Insomnia.  Choosing to treat or not to treat menopausal changes is an individual decision that you make with your health care provider. What should I know about hormone replacement therapy and supplements? Hormone therapy products are effective for treating symptoms that are associated with menopause, such as hot flashes and night sweats. Hormone replacement carries certain risks, especially as you become older. If you are thinking about using estrogen or estrogen with progestin treatments, discuss the benefits and risks with your health care provider. What should I know about heart disease and stroke? Heart disease, heart attack, and stroke become more likely as you age. This may be due, in part, to the hormonal changes that your body experiences during menopause. These can affect how your body processes dietary fats, triglycerides, and cholesterol. Heart attack and stroke are both medical emergencies. There are many things that you can do to help prevent heart disease  and stroke:  Have your blood pressure checked at least every 1-2 years. High blood pressure causes heart disease and increases the risk of stroke.  If you are 53-22 years old, ask your health care provider if you should take aspirin to prevent a heart attack or a stroke.  Do not use any tobacco products, including cigarettes, chewing tobacco, or electronic cigarettes. If you need help quitting, ask your health care provider.  It is important to eat a healthy diet and maintain a healthy weight. ? Be sure to include plenty of vegetables, fruits, low-fat dairy products, and lean protein. ? Avoid eating foods that are high in solid fats, added sugars, or salt (sodium).  Get regular exercise. This is one of the most important things that you can do for your health. ? Try to exercise for at least 150 minutes each week. The type of exercise that you do should increase your heart rate and make you sweat. This is known as moderate-intensity exercise. ? Try to do strengthening exercises at least twice each week. Do these in addition to the moderate-intensity exercise.  Know your numbers.Ask your health care provider to check your cholesterol and your blood glucose. Continue to have your blood tested as directed by your health care provider.  What should I know about cancer screening? There are several types of cancer. Take the following steps to reduce your risk and to catch any cancer development as early as possible. Breast Cancer  Practice breast self-awareness. ? This means understanding how your breasts normally appear and feel. ? It also means doing regular breast self-exams. Let your health care provider know about any changes, no matter how small.  If you are 40  or older, have a clinician do a breast exam (clinical breast exam or CBE) every year. Depending on your age, family history, and medical history, it may be recommended that you also have a yearly breast X-ray (mammogram).  If you  have a family history of breast cancer, talk with your health care provider about genetic screening.  If you are at high risk for breast cancer, talk with your health care provider about having an MRI and a mammogram every year.  Breast cancer (BRCA) gene test is recommended for women who have family members with BRCA-related cancers. Results of the assessment will determine the need for genetic counseling and BRCA1 and for BRCA2 testing. BRCA-related cancers include these types: ? Breast. This occurs in males or females. ? Ovarian. ? Tubal. This may also be called fallopian tube cancer. ? Cancer of the abdominal or pelvic lining (peritoneal cancer). ? Prostate. ? Pancreatic.  Cervical, Uterine, and Ovarian Cancer Your health care provider may recommend that you be screened regularly for cancer of the pelvic organs. These include your ovaries, uterus, and vagina. This screening involves a pelvic exam, which includes checking for microscopic changes to the surface of your cervix (Pap test).  For women ages 21-65, health care providers may recommend a pelvic exam and a Pap test every three years. For women ages 79-65, they may recommend the Pap test and pelvic exam, combined with testing for human papilloma virus (HPV), every five years. Some types of HPV increase your risk of cervical cancer. Testing for HPV may also be done on women of any age who have unclear Pap test results.  Other health care providers may not recommend any screening for nonpregnant women who are considered low risk for pelvic cancer and have no symptoms. Ask your health care provider if a screening pelvic exam is right for you.  If you have had past treatment for cervical cancer or a condition that could lead to cancer, you need Pap tests and screening for cancer for at least 20 years after your treatment. If Pap tests have been discontinued for you, your risk factors (such as having a new sexual partner) need to be  reassessed to determine if you should start having screenings again. Some women have medical problems that increase the chance of getting cervical cancer. In these cases, your health care provider may recommend that you have screening and Pap tests more often.  If you have a family history of uterine cancer or ovarian cancer, talk with your health care provider about genetic screening.  If you have vaginal bleeding after reaching menopause, tell your health care provider.  There are currently no reliable tests available to screen for ovarian cancer.  Lung Cancer Lung cancer screening is recommended for adults 69-62 years old who are at high risk for lung cancer because of a history of smoking. A yearly low-dose CT scan of the lungs is recommended if you:  Currently smoke.  Have a history of at least 30 pack-years of smoking and you currently smoke or have quit within the past 15 years. A pack-year is smoking an average of one pack of cigarettes per day for one year.  Yearly screening should:  Continue until it has been 15 years since you quit.  Stop if you develop a health problem that would prevent you from having lung cancer treatment.  Colorectal Cancer  This type of cancer can be detected and can often be prevented.  Routine colorectal cancer screening usually begins at  age 42 and continues through age 45.  If you have risk factors for colon cancer, your health care provider may recommend that you be screened at an earlier age.  If you have a family history of colorectal cancer, talk with your health care provider about genetic screening.  Your health care provider may also recommend using home test kits to check for hidden blood in your stool.  A small camera at the end of a tube can be used to examine your colon directly (sigmoidoscopy or colonoscopy). This is done to check for the earliest forms of colorectal cancer.  Direct examination of the colon should be repeated every  5-10 years until age 71. However, if early forms of precancerous polyps or small growths are found or if you have a family history or genetic risk for colorectal cancer, you may need to be screened more often.  Skin Cancer  Check your skin from head to toe regularly.  Monitor any moles. Be sure to tell your health care provider: ? About any new moles or changes in moles, especially if there is a change in a mole's shape or color. ? If you have a mole that is larger than the size of a pencil eraser.  If any of your family members has a history of skin cancer, especially at a young age, talk with your health care provider about genetic screening.  Always use sunscreen. Apply sunscreen liberally and repeatedly throughout the day.  Whenever you are outside, protect yourself by wearing long sleeves, pants, a wide-brimmed hat, and sunglasses.  What should I know about osteoporosis? Osteoporosis is a condition in which bone destruction happens more quickly than new bone creation. After menopause, you may be at an increased risk for osteoporosis. To help prevent osteoporosis or the bone fractures that can happen because of osteoporosis, the following is recommended:  If you are 46-71 years old, get at least 1,000 mg of calcium and at least 600 mg of vitamin D per day.  If you are older than age 55 but younger than age 65, get at least 1,200 mg of calcium and at least 600 mg of vitamin D per day.  If you are older than age 54, get at least 1,200 mg of calcium and at least 800 mg of vitamin D per day.  Smoking and excessive alcohol intake increase the risk of osteoporosis. Eat foods that are rich in calcium and vitamin D, and do weight-bearing exercises several times each week as directed by your health care provider. What should I know about how menopause affects my mental health? Depression may occur at any age, but it is more common as you become older. Common symptoms of depression  include:  Low or sad mood.  Changes in sleep patterns.  Changes in appetite or eating patterns.  Feeling an overall lack of motivation or enjoyment of activities that you previously enjoyed.  Frequent crying spells.  Talk with your health care provider if you think that you are experiencing depression. What should I know about immunizations? It is important that you get and maintain your immunizations. These include:  Tetanus, diphtheria, and pertussis (Tdap) booster vaccine.  Influenza every year before the flu season begins.  Pneumonia vaccine.  Shingles vaccine.  Your health care provider may also recommend other immunizations. This information is not intended to replace advice given to you by your health care provider. Make sure you discuss any questions you have with your health care provider. Document Released: 07/08/2005  Document Revised: 12/04/2015 Document Reviewed: 02/17/2015 Elsevier Interactive Patient Education  2018 Elsevier Inc.  

## 2017-05-17 NOTE — Progress Notes (Signed)
[name] 09-26-1955 [id number]    History:    Presents for annual exam.  Postmenopausal on no HRT with no bleeding. Married to long-term partner this past year. 2009 negative colonoscopy has follow-up scheduled March 2019. Normal Pap and mammogram history. 2016 T score -1.4 FRAX 7.7%/0.5%. History of GERD, angioedema from food allergies causing bowel obstructions. Primary care manages labs.  Past medical history, past surgical history, family history and social history were all reviewed and documented in the EPIC chart. Originally from [place]. Daughter graduated from med school at [employer] currently doing  residency at [employer]. 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  ROS:  A ROS was performed and pertinent positives and negatives are included.  Exam:  Vitals:   05/17/17 1601  BP: 120/82  Weight: 146 lb (66.2 kg)  Height: 5\' 4"  (1.626 m)   Body mass index is 25.06 kg/m.   General appearance:  Normal Thyroid:  Symmetrical, normal in size, without palpable masses or nodularity. Respiratory  Auscultation:  Clear without wheezing or rhonchi Cardiovascular  Auscultation:  Regular rate, without rubs, murmurs or gallops  Edema/varicosities:  Not grossly evident Abdominal  Soft,nontender, without masses, guarding or rebound.  Liver/spleen:  No organomegaly noted  Hernia:  None appreciated  Skin  Inspection:  Grossly normal   Breasts: Examined lying and sitting.     Right: Without masses, retractions, discharge or axillary adenopathy.     Left: Without masses, retractions, discharge or axillary adenopathy. Gentitourinary   Inguinal/mons:  Normal without inguinal adenopathy  External genitalia:  Normal  BUS/Urethra/Skene's glands:  Normal  Vagina:  Normal  Cervix:  Normal  Uterus:  normal in size, shape and contour.  Midline and mobile  Adnexa/parametria:     Rt: Without masses or tenderness.   Lt: Without masses or tenderness.  Anus and perineum: Normal  Digital rectal  exam: Normal sphincter tone without palpated masses or tenderness  Assessment/Plan:  61 y.o. MWF G1 P1  for annual exam with no complaints.  Postmenopausal/no HRT/no bleeding Primary care manages labs Osteopenia without elevated FRAX  Plan: Keep scheduled follow-up for screening colonoscopy March 2019. SBE's, continue annual screening mammogram. Repeat DEXA. Will schedule. Home safety, fall prevention and importance of continuing weightbearing exercise, currently working with a trainer 3 days a week. Pap normal 2016, new screening guidelines reviewed.  [place], 4:58 PM 05/17/2017

## 2017-05-18 LAB — RETICULIN ANTIBODIES, IGA W TITER: RETICULIN IGA SCREEN: NEGATIVE

## 2017-05-18 LAB — GLIADIN ANTIBODIES, SERUM
GLIADIN IGA: 3 U
Gliadin IgG: 2 Units

## 2017-05-18 LAB — TISSUE TRANSGLUTAMINASE, IGA: (tTG) Ab, IgA: 1 U/mL

## 2017-07-06 ENCOUNTER — Encounter: Payer: Self-pay | Admitting: Gastroenterology

## 2017-07-28 DIAGNOSIS — M8580 Other specified disorders of bone density and structure, unspecified site: Secondary | ICD-10-CM

## 2017-07-28 HISTORY — DX: Other specified disorders of bone density and structure, unspecified site: M85.80

## 2017-08-02 ENCOUNTER — Other Ambulatory Visit: Payer: Self-pay | Admitting: Gynecology

## 2017-08-02 DIAGNOSIS — Z13820 Encounter for screening for osteoporosis: Secondary | ICD-10-CM

## 2017-08-15 ENCOUNTER — Other Ambulatory Visit: Payer: Self-pay | Admitting: Gynecology

## 2017-08-15 ENCOUNTER — Ambulatory Visit (INDEPENDENT_AMBULATORY_CARE_PROVIDER_SITE_OTHER): Payer: BLUE CROSS/BLUE SHIELD

## 2017-08-15 ENCOUNTER — Encounter: Payer: Self-pay | Admitting: Gynecology

## 2017-08-15 DIAGNOSIS — M8589 Other specified disorders of bone density and structure, multiple sites: Secondary | ICD-10-CM

## 2017-08-15 DIAGNOSIS — Z13820 Encounter for screening for osteoporosis: Secondary | ICD-10-CM

## 2017-08-24 ENCOUNTER — Encounter: Payer: BLUE CROSS/BLUE SHIELD | Admitting: Gastroenterology

## 2017-09-14 DIAGNOSIS — M20011 Mallet finger of right finger(s): Secondary | ICD-10-CM | POA: Insufficient documentation

## 2017-10-13 ENCOUNTER — Encounter: Payer: Self-pay | Admitting: Internal Medicine

## 2017-10-13 ENCOUNTER — Other Ambulatory Visit (INDEPENDENT_AMBULATORY_CARE_PROVIDER_SITE_OTHER): Payer: BLUE CROSS/BLUE SHIELD

## 2017-10-13 ENCOUNTER — Ambulatory Visit (INDEPENDENT_AMBULATORY_CARE_PROVIDER_SITE_OTHER): Payer: BLUE CROSS/BLUE SHIELD | Admitting: Internal Medicine

## 2017-10-13 VITALS — BP 116/78 | HR 58 | Temp 98.1°F | Ht 64.0 in | Wt 149.0 lb

## 2017-10-13 DIAGNOSIS — E559 Vitamin D deficiency, unspecified: Secondary | ICD-10-CM

## 2017-10-13 DIAGNOSIS — E538 Deficiency of other specified B group vitamins: Secondary | ICD-10-CM | POA: Diagnosis not present

## 2017-10-13 DIAGNOSIS — Z23 Encounter for immunization: Secondary | ICD-10-CM

## 2017-10-13 DIAGNOSIS — Z0000 Encounter for general adult medical examination without abnormal findings: Secondary | ICD-10-CM

## 2017-10-13 DIAGNOSIS — Z91018 Allergy to other foods: Secondary | ICD-10-CM | POA: Diagnosis not present

## 2017-10-13 LAB — LIPID PANEL
Cholesterol: 207 mg/dL — ABNORMAL HIGH (ref 0–200)
HDL: 67 mg/dL (ref >39.00)
LDL Cholesterol: 129 mg/dL — ABNORMAL HIGH (ref 0–99)
NonHDL: 139.68
TRIGLYCERIDES: 51 mg/dL (ref 0.0–149.0)
Total CHOL/HDL Ratio: 3
VLDL: 10.2 mg/dL (ref 0.0–40.0)

## 2017-10-13 LAB — URINALYSIS, ROUTINE W REFLEX MICROSCOPIC
Bilirubin Urine: NEGATIVE
HGB URINE DIPSTICK: NEGATIVE
KETONES UR: NEGATIVE
NITRITE: NEGATIVE
RBC / HPF: NONE SEEN (ref 0–?)
Specific Gravity, Urine: 1.015 (ref 1.000–1.030)
TOTAL PROTEIN, URINE-UPE24: NEGATIVE
URINE GLUCOSE: NEGATIVE
UROBILINOGEN UA: 0.2 (ref 0.0–1.0)
pH: 7 (ref 5.0–8.0)

## 2017-10-13 LAB — CBC WITH DIFFERENTIAL/PLATELET
Basophils Absolute: 0 10*3/uL (ref 0.0–0.1)
Basophils Relative: 0.8 % (ref 0.0–3.0)
EOS PCT: 7.9 % — AB (ref 0.0–5.0)
Eosinophils Absolute: 0.4 10*3/uL (ref 0.0–0.7)
HEMATOCRIT: 40.6 % (ref 36.0–46.0)
HEMOGLOBIN: 13.9 g/dL (ref 12.0–15.0)
LYMPHS PCT: 34 % (ref 12.0–46.0)
Lymphs Abs: 1.7 10*3/uL (ref 0.7–4.0)
MCHC: 34.2 g/dL (ref 30.0–36.0)
MCV: 90.8 fl (ref 78.0–100.0)
MONOS PCT: 5.7 % (ref 3.0–12.0)
Monocytes Absolute: 0.3 10*3/uL (ref 0.1–1.0)
NEUTROS ABS: 2.6 10*3/uL (ref 1.4–7.7)
Neutrophils Relative %: 51.6 % (ref 43.0–77.0)
PLATELETS: 180 10*3/uL (ref 150.0–400.0)
RBC: 4.47 Mil/uL (ref 3.87–5.11)
RDW: 13.2 % (ref 11.5–15.5)
WBC: 5 10*3/uL (ref 4.0–10.5)

## 2017-10-13 LAB — TSH: TSH: 2 u[IU]/mL (ref 0.35–4.50)

## 2017-10-13 LAB — HEPATIC FUNCTION PANEL
ALBUMIN: 4.1 g/dL (ref 3.5–5.2)
ALT: 15 U/L (ref 0–35)
AST: 16 U/L (ref 0–37)
Alkaline Phosphatase: 55 U/L (ref 39–117)
Bilirubin, Direct: 0.1 mg/dL (ref 0.0–0.3)
TOTAL PROTEIN: 7.1 g/dL (ref 6.0–8.3)
Total Bilirubin: 0.5 mg/dL (ref 0.2–1.2)

## 2017-10-13 LAB — BASIC METABOLIC PANEL
BUN: 12 mg/dL (ref 6–23)
CHLORIDE: 104 meq/L (ref 96–112)
CO2: 30 mEq/L (ref 19–32)
CREATININE: 0.7 mg/dL (ref 0.40–1.20)
Calcium: 9.2 mg/dL (ref 8.4–10.5)
GFR: 90.67 mL/min (ref >60.00)
Glucose, Bld: 98 mg/dL (ref 70–99)
Potassium: 4.1 mEq/L (ref 3.5–5.1)
SODIUM: 140 meq/L (ref 135–145)

## 2017-10-13 LAB — VITAMIN B12: VITAMIN B 12: 1010 pg/mL — AB (ref 211–911)

## 2017-10-13 LAB — VITAMIN D 25 HYDROXY (VIT D DEFICIENCY, FRACTURES): VITD: 23.18 ng/mL — AB (ref 30.00–100.00)

## 2017-10-13 MED ORDER — ZOLPIDEM TARTRATE 5 MG PO TABS: 5.0000 mg | ORAL_TABLET | Freq: Every evening | ORAL | 2 refills | Status: DC | PRN

## 2017-10-13 MED ORDER — "SYRINGE/NEEDLE (DISP) 30G X 1/2"" 1 ML MISC": 1.0000 | 5 refills | Status: DC

## 2017-10-13 MED ORDER — CYANOCOBALAMIN 1000 MCG/ML IJ SOLN: 1000.0000 ug | INTRAMUSCULAR | 6 refills | Status: DC

## 2017-10-13 NOTE — Assessment & Plan Note (Signed)
On B12 

## 2017-10-13 NOTE — Progress Notes (Signed)
Subjective:  Patient ID: [name], female    DOB: 20-May-1956  Age: 62 y.o. MRN: [id number]  CC: No chief complaint on file.   HPI ETHELENE CLOSSER presents for a well exam C/o heartburn, epig pain - upper and lower endoscopy - pending  Outpatient Medications Prior to Visit  Medication Sig Dispense Refill  . Cholecalciferol (EQL VITAMIN D3) 1000 UNITS tablet Take 1 tablet (1,000 Units total) by mouth daily. 2 tabs po daily 100 tablet 3  . cyanocobalamin (,VITAMIN B-12,) 1000 MCG/ML injection Inject 1 mL (1,000 mcg total) into the muscle every 30 (thirty) days. 10 mL 6  . EPINEPHrine 0.3 mg/0.3 mL IJ SOAJ injection Inject 0.3 mLs (0.3 mg total) into the muscle once. 1 Device 0  . OMEPRAZOLE PO Take by mouth.    . Syringe/Needle, Disp, (B-D ECLIPSE SYRINGE) 30G X 1/2" 1 ML MISC 1 each by Does not apply route 1 day or 1 dose. 50 each 5  . zolpidem (AMBIEN) 5 MG tablet Take 1 tablet (5 mg total) by mouth at bedtime as needed. for sleep 30 tablet 2   No facility-administered medications prior to visit.     ROS Review of Systems  Constitutional: Negative for activity change, appetite change, chills, fatigue and unexpected weight change.  HENT: Negative for congestion, mouth sores and sinus pressure.   Eyes: Negative for visual disturbance.  Respiratory: Negative for cough and chest tightness.   Gastrointestinal: Positive for abdominal pain. Negative for nausea.  Genitourinary: Negative for difficulty urinating, frequency and vaginal pain.  Musculoskeletal: Negative for back pain and gait problem.  Skin: Negative for pallor and rash.  Neurological: Negative for dizziness, tremors, weakness, numbness and headaches.  Psychiatric/Behavioral: Negative for confusion, sleep disturbance and suicidal ideas.    Objective:  BP 116/78 (BP Location: Left Arm, Patient Position: Sitting, Cuff Size: Normal)   Pulse (!) 58   Temp 98.1 F (36.7 C) (Oral)   Ht 5\' 4"  (1.626 m)   Wt 149 lb  (67.6 kg)   SpO2 99%   BMI 25.58 kg/m   BP Readings from Last 3 Encounters:  10/13/17 116/78  05/17/17 120/82  05/15/17 102/70    Wt Readings from Last 3 Encounters:  10/13/17 149 lb (67.6 kg)  05/17/17 146 lb (66.2 kg)  05/15/17 148 lb (67.1 kg)    Physical Exam  Constitutional: She appears well-developed. No distress.  HENT:  Head: Normocephalic.  Right Ear: External ear normal.  Left Ear: External ear normal.  Nose: Nose normal.  Mouth/Throat: Oropharynx is clear and moist.  Eyes: Pupils are equal, round, and reactive to light. Conjunctivae are normal. Right eye exhibits no discharge. Left eye exhibits no discharge.  Neck: Normal range of motion. Neck supple. No JVD present. No tracheal deviation present. No thyromegaly present.  Cardiovascular: Normal rate, regular rhythm and normal heart sounds.  Pulmonary/Chest: No stridor. No respiratory distress. She has no wheezes.  Abdominal: Soft. Bowel sounds are normal. She exhibits no distension and no mass. There is no tenderness. There is no rebound and no guarding.  Musculoskeletal: She exhibits no edema or tenderness.  Lymphadenopathy:    She has no cervical adenopathy.  Neurological: She displays normal reflexes. No cranial nerve deficit. She exhibits normal muscle tone. Coordination normal.  Skin: No rash noted. No erythema.  Psychiatric: She has a normal mood and affect. Her behavior is normal. Judgment and thought content normal.    Lab Results  Component Value Date   WBC  5.6 10/12/2016   HGB 13.1 10/12/2016   HCT 38.6 10/12/2016   PLT 170.0 10/12/2016   GLUCOSE 88 10/12/2016   CHOL 195 10/12/2016   TRIG 52.0 10/12/2016   HDL 60.70 10/12/2016   LDLCALC 124 (H) 10/12/2016   ALT 17 10/12/2016   AST 16 10/12/2016   NA 140 10/12/2016   K 4.0 10/12/2016   CL 104 10/12/2016   CREATININE 0.65 10/12/2016   BUN 13 10/12/2016   CO2 31 10/12/2016   TSH 3.25 10/12/2016    Mm Digital Screening Bilateral  Result  Date: 05/13/2016 CLINICAL DATA:  Screening. EXAM: DIGITAL SCREENING BILATERAL MAMMOGRAM WITH CAD COMPARISON:  Previous exam(s). ACR Breast Density Category b: There are scattered areas of fibroglandular density. FINDINGS: There are no findings suspicious for malignancy. Images were processed with CAD. IMPRESSION: No mammographic evidence of malignancy. A result letter of this screening mammogram will be mailed directly to the patient. RECOMMENDATION: Screening mammogram in one year. (Code:SM-B-01Y) BI-RADS CATEGORY  1: Negative. Electronically Signed   By: Lajean Manes M.D.   On: 05/16/2016 16:57    Assessment & Plan:   There are no diagnoses linked to this encounter. I am having Bertine I. Grussing maintain her Cholecalciferol, EPINEPHrine, zolpidem, cyanocobalamin, Syringe/Needle (Disp), and OMEPRAZOLE PO.  No orders of the defined types were placed in this encounter.    Follow-up: No follow-ups on file.  Walker Kehr, MD

## 2017-10-13 NOTE — Assessment & Plan Note (Signed)
Severe intestinal allergy to tuna, salmon Benadryl, Prednisone prn

## 2017-10-13 NOTE — Assessment & Plan Note (Signed)
On Vit D 

## 2017-10-13 NOTE — Patient Instructions (Signed)
  Gluten free trial for 4-6 weeks. OK to use gluten-free bread and gluten-free pasta.    Gluten-Free Diet for Celiac Disease, Adult The gluten-free diet includes all foods that do not contain gluten. Gluten is a protein that is found in wheat, rye, barley, and some other grains. Following the gluten-free diet is the only treatment for people with celiac disease. It helps to prevent damage to the intestines and improves or eliminates the symptoms of celiac disease. Following the gluten-free diet requires some planning. It can be challenging at first, but it gets easier with time and practice. There are more gluten-free options available today than ever before. If you need help finding gluten-free foods or if you have questions, talk with your diet and nutrition specialist (registered dietitian) or your health care provider. What do I need to know about a gluten-free diet?  All fruits, vegetables, and meats are safe to eat and do not contain gluten.  When grocery shopping, start by shopping in the produce, meat, and dairy sections. These sections are more likely to contain gluten-free foods. Then move to the aisles that contain packaged foods if you need to.  Read all food labels. Gluten is often added to foods. Always check the ingredient list and look for warnings, such as "may contain gluten."  Talk with your dietitian or health care provider before taking a gluten-free multivitamin or mineral supplement.  Be aware of gluten-free foods having contact with foods that contain gluten (cross-contamination). This can happen at home and with any processed foods. ? Talk with your health care provider or dietitian about how to reduce the risk of cross-contamination in your home. ? If you have questions about how a food is processed, ask the manufacturer. What key words help to identify gluten? Foods that list any of these key words on the label usually contain gluten:  Wheat, flour, enriched  flour, bromated flour, white flour, durum flour, graham flour, phosphated flour, self-rising flour, semolina, farina, barley (malt), rye, and oats.  Starch, dextrin, modified food starch, or cereal.  Thickening, fillers, or emulsifiers.  Malt flavoring, malt extract, or malt syrup.  Hydrolyzed vegetable protein.  In the [place] packaged foods that are gluten-free are required to be labeled "GF." These foods should be easy to identify and are safe to eat. In the [place] food companies are also required to list common food allergens, including wheat, on their labels. Recommended foods Grains  Amaranth, bean flours, 100% buckwheat flour, corn, millet, nut flours or nut meals, GF oats, quinoa, rice, sorghum, teff, rice wafers, pure cornmeal tortillas, popcorn, and hot cereals made from cornmeal. Hominy, rice, wild rice. Some Asian rice noodles or bean noodles. Arrowroot starch, corn bran, corn flour, corn germ, cornmeal, corn starch, potato flour, potato starch flour, and rice bran. Plain, brown, and sweet rice flours. Rice polish, soy flour, and tapioca starch. Vegetables  All plain fresh, frozen, and canned vegetables. Fruits  All plain fresh, frozen, canned, and dried fruits, and 100% fruit juices. Meats and other protein foods  All fresh beef, pork, poultry, fish, seafood, and eggs. Fish canned in water, oil, brine, or vegetable broth. Plain nuts and seeds, peanut butter. Some lunch meat and some frankfurters. Dried beans, dried peas, and lentils. Dairy  Fresh plain, dry, evaporated, or condensed milk. Cream, butter, sour cream, whipping cream, and most yogurts. Unprocessed cheese, most processed cheeses, some cottage cheese, some cream cheeses. Beverages  Coffee, tea, most herbal teas. Carbonated beverages and some root beers.   Wine, sake, and distilled spirits, such as gin, vodka, and whiskey. Most hard ciders. Fats and oils  Butter, margarine, vegetable oil, hydrogenated butter, olive  oil, shortening, lard, cream, and some mayonnaise. Some commercial salad dressings. Olives. Sweets and desserts  Sugar, honey, some syrups, molasses, jelly, and jam. Plain hard candy, marshmallows, and gumdrops. Pure cocoa powder. Plain chocolate. Custard and some pudding mixes. Gelatin desserts, sorbets, frozen ice pops, and sherbet. Cake, cookies, and other desserts prepared with allowed flours. Some commercial ice creams. Cornstarch, tapioca, and rice puddings. Seasoning and other foods  Some canned or frozen soups. Monosodium glutamate (MSG). Cider, rice, and wine vinegar. Baking soda and baking powder. Cream of tartar. Baking and nutritional yeast. Certain soy sauces made without wheat (ask your dietitian about specific brands that are allowed). Nuts, coconut, and chocolate. Salt, pepper, herbs, spices, flavoring extracts, imitation or artificial flavorings, natural flavorings, and food colorings. Some medicines and supplements. Some lip glosses and other cosmetics. Rice syrups. The items listed may not be a complete list. Talk with your dietitian about what dietary choices are best for you. Foods to avoid Grains  Barley, bran, bulgur, couscous, cracked wheat, [place], farro, graham, malt, matzo, semolina, wheat germ, and all wheat and rye cereals including spelt and kamut. Cereals containing malt as a flavoring, such as rice cereal. Noodles, spaghetti, macaroni, most packaged rice mixes, and all mixes containing wheat, rye, barley, or triticale. Vegetables  Most creamed vegetables and most vegetables canned in sauces. Some commercially prepared vegetables and salads. Fruits  Thickened or prepared fruits and some pie fillings. Some fruit snacks and fruit roll-ups. Meats and other protein foods  Any meat or meat alternative containing wheat, rye, barley, or gluten stabilizers. These are often marinated or packaged meats and lunch meats. Bread-containing products, such as Swiss steak,  croquettes, meatballs, and meatloaf. Most tuna canned in vegetable broth and turkey with hydrolyzed vegetable protein (HVP) injected as part of the basting. Seitan. Imitation fish. Eggs in sauces made from ingredients to avoid. Dairy  Commercial chocolate milk drinks and malted milk. Some non-dairy creamers. Any cheese product containing ingredients to avoid. Beverages  Certain cereal beverages. Beer, ale, malted milk, and some root beers. Some hard ciders. Some instant flavored coffees. Some herbal teas made with barley or with barley malt added. Fats and oils  Some commercial salad dressings. Sour cream containing modified food starch. Sweets and desserts  Some toffees. Chocolate-coated nuts (may be rolled in wheat flour) and some commercial candies and candy bars. Most cakes, cookies, donuts, pastries, and other baked goods. Some commercial ice cream. Ice cream cones. Commercially prepared mixes for cakes, cookies, and other desserts. Bread pudding and other puddings thickened with flour. Products containing brown rice syrup made with barley malt enzyme. Desserts and sweets made with malt flavoring. Seasoning and other foods  Some curry powders, some dry seasoning mixes, some gravy extracts, some meat sauces, some ketchups, some prepared mustards, and horseradish. Certain soy sauces. Malt vinegar. Bouillon and bouillon cubes that contain HVP. Some chip dips, and some chewing gum. Yeast extract. Brewer's yeast. Caramel color. Some medicines and supplements. Some lip glosses and other cosmetics. The items listed may not be a complete list. Talk with your dietitian about what dietary choices are best for you. Summary  Gluten is a protein that is found in wheat, rye, barley, and some other grains. The gluten-free diet includes all foods that do not contain gluten.  If you need help finding gluten-free foods or if   you have questions, talk with your diet and nutrition specialist (registered  dietitian) or your health care provider.  Read all food labels. Gluten is often added to foods. Always check the ingredient list and look for warnings, such as "may contain gluten." This information is not intended to replace advice given to you by your health care provider. Make sure you discuss any questions you have with your health care provider. Document Released: 05/16/2005 Document Revised: 02/29/2016 Document Reviewed: 02/29/2016 Elsevier Interactive Patient Education  2018 [employer]   

## 2017-10-13 NOTE — Assessment & Plan Note (Signed)
We discussed age appropriate health related issues, including available/recomended screening tests and vaccinations. We discussed a need for adhering to healthy diet and exercise. Labs were ordered to be later reviewed . All questions were answered.  Endo pending Ophth, GYN q 12 mo BDS

## 2017-10-16 ENCOUNTER — Other Ambulatory Visit: Payer: Self-pay | Admitting: Internal Medicine

## 2017-10-16 MED ORDER — CHOLECALCIFEROL 25 MCG (1000 UT) PO TABS: 2000.0000 [IU] | ORAL_TABLET | Freq: Every day | ORAL | 3 refills | Status: AC

## 2017-10-16 MED ORDER — VITAMIN D3 1.25 MG (50000 UT) PO CAPS: 1.0000 | ORAL_CAPSULE | ORAL | 0 refills | Status: DC

## 2017-10-17 ENCOUNTER — Encounter: Payer: BLUE CROSS/BLUE SHIELD | Admitting: Gastroenterology

## 2017-10-24 ENCOUNTER — Ambulatory Visit (AMBULATORY_SURGERY_CENTER): Payer: Self-pay | Admitting: *Deleted

## 2017-10-24 ENCOUNTER — Other Ambulatory Visit: Payer: Self-pay

## 2017-10-24 VITALS — Ht 64.5 in | Wt 150.0 lb

## 2017-10-24 DIAGNOSIS — Z800 Family history of malignant neoplasm of digestive organs: Secondary | ICD-10-CM

## 2017-10-24 DIAGNOSIS — K219 Gastro-esophageal reflux disease without esophagitis: Secondary | ICD-10-CM

## 2017-10-24 MED ORDER — NA SULFATE-K SULFATE-MG SULF 17.5-3.13-1.6 GM/177ML PO SOLN: 1.0000 | Freq: Once | ORAL | 0 refills | Status: AC

## 2017-10-24 NOTE — Progress Notes (Signed)
No egg or soy allergy known to patient  No issues with past sedation with any surgeries  or procedures, no intubation problems  No diet pills per patient No home 02 use per patient  No blood thinners per patient  Pt denies issues with constipation  No A fib or A flutter  EMMI video sent to pt's e mail - $15 coupon for Suprep to pt in [place] today  

## 2017-11-02 ENCOUNTER — Encounter: Payer: Self-pay | Admitting: Gastroenterology

## 2017-11-07 ENCOUNTER — Encounter: Payer: Self-pay | Admitting: Gastroenterology

## 2017-11-07 ENCOUNTER — Ambulatory Visit (AMBULATORY_SURGERY_CENTER): Payer: BLUE CROSS/BLUE SHIELD | Admitting: Gastroenterology

## 2017-11-07 ENCOUNTER — Other Ambulatory Visit: Payer: Self-pay

## 2017-11-07 VITALS — BP 120/71 | HR 61 | Temp 96.8°F | Resp 11 | Ht 64.0 in | Wt 150.0 lb

## 2017-11-07 DIAGNOSIS — K219 Gastro-esophageal reflux disease without esophagitis: Secondary | ICD-10-CM | POA: Diagnosis not present

## 2017-11-07 DIAGNOSIS — Z1211 Encounter for screening for malignant neoplasm of colon: Secondary | ICD-10-CM

## 2017-11-07 DIAGNOSIS — Z800 Family history of malignant neoplasm of digestive organs: Secondary | ICD-10-CM

## 2017-11-07 DIAGNOSIS — D123 Benign neoplasm of transverse colon: Secondary | ICD-10-CM

## 2017-11-07 MED ORDER — SODIUM CHLORIDE 0.9 % IV SOLN: 500.0000 mL | Freq: Once | INTRAVENOUS | Status: DC

## 2017-11-07 NOTE — Progress Notes (Signed)
Report to PACU, RN, vss, BBS= Clear.  

## 2017-11-07 NOTE — Op Note (Signed)
[facility] Patient Name: [name] Procedure Date: 11/07/2017 8:29 AM MRN: [id number] Endoscopist: Mauri Pole , MD Age: 62 Referring MD:  Date of Birth: 1955-07-31 Gender: Female Account #: [id number] Procedure:                Colonoscopy Indications:              Screening patient at increased risk: Family history                            of 1st-degree relative with colorectal cancer at                            age 49 years (or older) Medicines:                Monitored Anesthesia Care Procedure:                Pre-Anesthesia Assessment:                           - Prior to the procedure, a History and Physical                            was performed, and patient medications and                            allergies were reviewed. The patient's tolerance of                            previous anesthesia was also reviewed. The risks                            and benefits of the procedure and the sedation                            options and risks were discussed with the patient.                            All questions were answered, and informed consent                            was obtained. Prior Anticoagulants: The patient has                            taken no previous anticoagulant or antiplatelet                            agents. ASA Grade Assessment: II - A patient with                            mild systemic disease. After reviewing the risks                            and benefits, the patient was deemed in  satisfactory condition to undergo the procedure.                           After obtaining informed consent, the colonoscope                            was passed under direct vision. Throughout the                            procedure, the patient's blood pressure, pulse, and                            oxygen saturations were monitored continuously. The                            Colonoscope was introduced  through the anus and                            advanced to the the cecum, identified by                            appendiceal orifice and ileocecal valve. The                            colonoscopy was performed without difficulty. The                            patient tolerated the procedure well. The quality                            of the bowel preparation was excellent. The                            ileocecal valve, appendiceal orifice, and rectum                            were photographed. Scope In: 8:40:42 AM Scope Out: 6:64:40 AM Scope Withdrawal Time: 0 hours 10 minutes 33 seconds  Total Procedure Duration: 0 hours 15 minutes 37 seconds  Findings:                 The perianal and digital rectal examinations were                            normal.                           A 7 mm polyp was found in the transverse colon. The                            polyp was sessile. The polyp was removed with a                            cold snare. Resection and retrieval were complete.  Non-bleeding internal hemorrhoids were found during                            retroflexion. The hemorrhoids were small.                           The exam was otherwise without abnormality. Complications:            No immediate complications. Estimated Blood Loss:     Estimated blood loss was minimal. Impression:               - One 7 mm polyp in the transverse colon, removed                            with a cold snare. Resected and retrieved.                           - Non-bleeding internal hemorrhoids.                           - The examination was otherwise normal. Recommendation:           - Patient has a contact number available for                            emergencies. The signs and symptoms of potential                            delayed complications were discussed with the                            patient. Return to normal activities tomorrow.                             Written discharge instructions were provided to the                            patient.                           - Resume previous diet.                           - Continue present medications.                           - Await pathology results.                           - Repeat colonoscopy in 5 years for surveillance                            based on pathology results. Mauri Pole, MD 11/07/2017 9:05:19 AM This report has been signed electronically.

## 2017-11-07 NOTE — Progress Notes (Signed)
Pt. Reports she pulled a tick off of left shoulder 2-3 days ago  Light redness left shoulder, no swelling or drainage.

## 2017-11-07 NOTE — Op Note (Signed)
[facility] Patient Name: [name] Procedure Date: 11/07/2017 8:28 AM MRN: [id number] Endoscopist: Mauri Pole , MD Age: 62 Referring MD:  Date of Birth: 03-16-1956 Gender: Female Account #: [id number] Procedure:                Upper GI endoscopy Indications:              Esophageal reflux symptoms that persist despite                            appropriate therapy Medicines:                Monitored Anesthesia Care Procedure:                Pre-Anesthesia Assessment:                           - Prior to the procedure, a History and Physical                            was performed, and patient medications and                            allergies were reviewed. The patient's tolerance of                            previous anesthesia was also reviewed. The risks                            and benefits of the procedure and the sedation                            options and risks were discussed with the patient.                            All questions were answered, and informed consent                            was obtained. Prior Anticoagulants: The patient has                            taken no previous anticoagulant or antiplatelet                            agents. ASA Grade Assessment: II - A patient with                            mild systemic disease. After reviewing the risks                            and benefits, the patient was deemed in                            satisfactory condition to undergo the procedure.  After obtaining informed consent, the endoscope was                            passed under direct vision. Throughout the                            procedure, the patient's blood pressure, pulse, and                            oxygen saturations were monitored continuously. The                            Endoscope was introduced through the mouth, and                            advanced to the second part of  duodenum. The upper                            GI endoscopy was accomplished without difficulty.                            The patient tolerated the procedure well. Scope In: Scope Out: Findings:                 The esophagus was normal.                           Esophagogastric landmarks were identified: the                            Z-line regular was found at 36 cm from the incisors.                           The stomach was normal.                           The examined duodenum was normal. Complications:            No immediate complications. Estimated Blood Loss:     Estimated blood loss: none. Impression:               - Normal esophagus.                           - Esophagogastric landmarks identified.                           - Normal stomach.                           - Normal examined duodenum.                           - No specimens collected. Recommendation:           - Patient has a contact number available for  emergencies. The signs and symptoms of potential                            delayed complications were discussed with the                            patient. Return to normal activities tomorrow.                            Written discharge instructions were provided to the                            patient.                           - Resume previous diet.                           - Continue present medications.                           - No repeat upper endoscopy.                           - Return to GI office PRN. Mauri Pole, MD 11/07/2017 9:03:05 AM This report has been signed electronically.

## 2017-11-07 NOTE — Progress Notes (Signed)
Called to room to assist during endoscopic procedure.  Patient ID and intended procedure confirmed with present staff. Received instructions for my participation in the procedure from the performing physician.  

## 2017-11-07 NOTE — Patient Instructions (Signed)
Please read handouts on polyps and hemorrhoids that were provided.    YOU HAD AN ENDOSCOPIC PROCEDURE TODAY AT [facility]:   Refer to the procedure report that was given to you for any specific questions about what was found during the examination.  If the procedure report does not answer your questions, please call your gastroenterologist to clarify.  If you requested that your care partner not be given the details of your procedure findings, then the procedure report has been included in a sealed envelope for you to review at your convenience later.  YOU SHOULD EXPECT: Some feelings of bloating in the abdomen. Passage of more gas than usual.  Walking can help get rid of the air that was put into your GI tract during the procedure and reduce the bloating. If you had a lower endoscopy (such as a colonoscopy or flexible sigmoidoscopy) you may notice spotting of blood in your stool or on the toilet paper. If you underwent a bowel prep for your procedure, you may not have a normal bowel movement for a few days.  Please Note:  You might notice some irritation and congestion in your nose or some drainage.  This is from the oxygen used during your procedure.  There is no need for concern and it should clear up in a day or so.  SYMPTOMS TO REPORT IMMEDIATELY:   Following lower endoscopy (colonoscopy or flexible sigmoidoscopy):  Excessive amounts of blood in the stool  Significant tenderness or worsening of abdominal pains  Swelling of the abdomen that is new, acute  Fever of 100F or higher   Following upper endoscopy (EGD)  Vomiting of blood or coffee ground material  New chest pain or pain under the shoulder blades  Painful or persistently difficult swallowing  New shortness of breath  Fever of 100F or higher  Black, tarry-looking stools  For urgent or emergent issues, a gastroenterologist can be reached at any hour by calling [phone].   DIET:  We do recommend a  small meal at first, but then you may proceed to your regular diet.  Drink plenty of fluids but you should avoid alcoholic beverages for 24 hours.  ACTIVITY:  You should plan to take it easy for the rest of today and you should NOT DRIVE or use heavy machinery until tomorrow (because of the sedation medicines used during the test).    FOLLOW UP: Our staff will call the number listed on your records the next business day following your procedure to check on you and address any questions or concerns that you may have regarding the information given to you following your procedure. If we do not reach you, we will leave a message.  However, if you are feeling well and you are not experiencing any problems, there is no need to return our call.  We will assume that you have returned to your regular daily activities without incident.  If any biopsies were taken you will be contacted by phone or by letter within the next 1-3 weeks.  Please call us at [phone] if you have not heard about the biopsies in 3 weeks.    SIGNATURES/CONFIDENTIALITY: You and/or your care partner have signed paperwork which will be entered into your electronic medical record.  These signatures attest to the fact that that the information above on your After Visit Summary has been reviewed and is understood.  Full responsibility of the confidentiality of this discharge information lies with you and/or your  care-partner.

## 2017-11-08 ENCOUNTER — Telehealth: Payer: Self-pay | Admitting: *Deleted

## 2017-11-08 NOTE — Telephone Encounter (Signed)
  Follow up Call-  Call back number 11/07/2017  Post procedure Call Back phone  # [phone]  Permission to leave phone message Yes  Some recent data might be hidden     Patient questions:  Do you have a fever, pain , or abdominal swelling? No. Pain Score  0 *  Have you tolerated food without any problems? Yes.    Have you been able to return to your normal activities? Yes.    Do you have any questions about your discharge instructions: Diet   No. Medications  No. Follow up visit  No.  Do you have questions or concerns about your Care? No.  Actions: * If pain score is 4 or above: No action needed, pain <4.

## 2017-11-20 ENCOUNTER — Encounter: Payer: Self-pay | Admitting: Gastroenterology

## 2018-01-05 ENCOUNTER — Ambulatory Visit: Payer: BLUE CROSS/BLUE SHIELD | Admitting: Internal Medicine

## 2018-05-21 ENCOUNTER — Encounter: Payer: BLUE CROSS/BLUE SHIELD | Admitting: Women's Health

## 2018-08-13 ENCOUNTER — Encounter: Payer: BLUE CROSS/BLUE SHIELD | Admitting: Women's Health

## 2018-10-08 ENCOUNTER — Other Ambulatory Visit: Payer: Self-pay

## 2018-10-08 ENCOUNTER — Ambulatory Visit (INDEPENDENT_AMBULATORY_CARE_PROVIDER_SITE_OTHER): Payer: No Typology Code available for payment source | Admitting: Women's Health

## 2018-10-08 ENCOUNTER — Encounter: Payer: Self-pay | Admitting: Women's Health

## 2018-10-08 VITALS — BP 130/80 | Ht 64.0 in | Wt 151.0 lb

## 2018-10-08 DIAGNOSIS — Z01419 Encounter for gynecological examination (general) (routine) without abnormal findings: Secondary | ICD-10-CM

## 2018-10-08 NOTE — Patient Instructions (Signed)
shingrex  2 series shingles   Health Maintenance for Postmenopausal Women Menopause is a normal process in which your reproductive ability comes to an end. This process happens gradually over a span of months to years, usually between the ages of 29 and 20. Menopause is complete when you have missed 12 consecutive menstrual periods. It is important to talk with your health care provider about some of the most common conditions that affect postmenopausal women, such as heart disease, cancer, and bone loss (osteoporosis). Adopting a healthy lifestyle and getting preventive care can help to promote your health and wellness. Those actions can also lower your chances of developing some of these common conditions. What should I know about menopause? During menopause, you may experience a number of symptoms, such as:  Moderate-to-severe hot flashes.  Night sweats.  Decrease in sex drive.  Mood swings.  Headaches.  Tiredness.  Irritability.  Memory problems.  Insomnia. Choosing to treat or not to treat menopausal changes is an individual decision that you make with your health care provider. What should I know about hormone replacement therapy and supplements? Hormone therapy products are effective for treating symptoms that are associated with menopause, such as hot flashes and night sweats. Hormone replacement carries certain risks, especially as you become older. If you are thinking about using estrogen or estrogen with progestin treatments, discuss the benefits and risks with your health care provider. What should I know about heart disease and stroke? Heart disease, heart attack, and stroke become more likely as you age. This may be due, in part, to the hormonal changes that your body experiences during menopause. These can affect how your body processes dietary fats, triglycerides, and cholesterol. Heart attack and stroke are both medical emergencies. There are many things that you can do  to help prevent heart disease and stroke:  Have your blood pressure checked at least every 1-2 years. High blood pressure causes heart disease and increases the risk of stroke.  If you are 21-12 years old, ask your health care provider if you should take aspirin to prevent a heart attack or a stroke.  Do not use any tobacco products, including cigarettes, chewing tobacco, or electronic cigarettes. If you need help quitting, ask your health care provider.  It is important to eat a healthy diet and maintain a healthy weight. ? Be sure to include plenty of vegetables, fruits, low-fat dairy products, and lean protein. ? Avoid eating foods that are high in solid fats, added sugars, or salt (sodium).  Get regular exercise. This is one of the most important things that you can do for your health. ? Try to exercise for at least 150 minutes each week. The type of exercise that you do should increase your heart rate and make you sweat. This is known as moderate-intensity exercise. ? Try to do strengthening exercises at least twice each week. Do these in addition to the moderate-intensity exercise.  Know your numbers.Ask your health care provider to check your cholesterol and your blood glucose. Continue to have your blood tested as directed by your health care provider.  What should I know about cancer screening? There are several types of cancer. Take the following steps to reduce your risk and to catch any cancer development as early as possible. Breast Cancer  Practice breast self-awareness. ? This means understanding how your breasts normally appear and feel. ? It also means doing regular breast self-exams. Let your health care provider know about any changes, no matter how  small.  If you are 40 or older, have a clinician do a breast exam (clinical breast exam or CBE) every year. Depending on your age, family history, and medical history, it may be recommended that you also have a yearly breast  X-ray (mammogram).  If you have a family history of breast cancer, talk with your health care provider about genetic screening.  If you are at high risk for breast cancer, talk with your health care provider about having an MRI and a mammogram every year.  Breast cancer (BRCA) gene test is recommended for women who have family members with BRCA-related cancers. Results of the assessment will determine the need for genetic counseling and BRCA1 and for BRCA2 testing. BRCA-related cancers include these types: ? Breast. This occurs in males or females. ? Ovarian. ? Tubal. This may also be called fallopian tube cancer. ? Cancer of the abdominal or pelvic lining (peritoneal cancer). ? Prostate. ? Pancreatic. Cervical, Uterine, and Ovarian Cancer Your health care provider may recommend that you be screened regularly for cancer of the pelvic organs. These include your ovaries, uterus, and vagina. This screening involves a pelvic exam, which includes checking for microscopic changes to the surface of your cervix (Pap test).  For women ages 21-65, health care providers may recommend a pelvic exam and a Pap test every three years. For women ages 9-65, they may recommend the Pap test and pelvic exam, combined with testing for human papilloma virus (HPV), every five years. Some types of HPV increase your risk of cervical cancer. Testing for HPV may also be done on women of any age who have unclear Pap test results.  Other health care providers may not recommend any screening for nonpregnant women who are considered low risk for pelvic cancer and have no symptoms. Ask your health care provider if a screening pelvic exam is right for you.  If you have had past treatment for cervical cancer or a condition that could lead to cancer, you need Pap tests and screening for cancer for at least 20 years after your treatment. If Pap tests have been discontinued for you, your risk factors (such as having a new sexual  partner) need to be reassessed to determine if you should start having screenings again. Some women have medical problems that increase the chance of getting cervical cancer. In these cases, your health care provider may recommend that you have screening and Pap tests more often.  If you have a family history of uterine cancer or ovarian cancer, talk with your health care provider about genetic screening.  If you have vaginal bleeding after reaching menopause, tell your health care provider.  There are currently no reliable tests available to screen for ovarian cancer. Lung Cancer Lung cancer screening is recommended for adults 17-78 years old who are at high risk for lung cancer because of a history of smoking. A yearly low-dose CT scan of the lungs is recommended if you:  Currently smoke.  Have a history of at least 30 pack-years of smoking and you currently smoke or have quit within the past 15 years. A pack-year is smoking an average of one pack of cigarettes per day for one year. Yearly screening should:  Continue until it has been 15 years since you quit.  Stop if you develop a health problem that would prevent you from having lung cancer treatment. Colorectal Cancer  This type of cancer can be detected and can often be prevented.  Routine colorectal cancer screening usually  begins at age 18 and continues through age 80.  If you have risk factors for colon cancer, your health care provider may recommend that you be screened at an earlier age.  If you have a family history of colorectal cancer, talk with your health care provider about genetic screening.  Your health care provider may also recommend using home test kits to check for hidden blood in your stool.  A small camera at the end of a tube can be used to examine your colon directly (sigmoidoscopy or colonoscopy). This is done to check for the earliest forms of colorectal cancer.  Direct examination of the colon should be  repeated every 5-10 years until age 2. However, if early forms of precancerous polyps or small growths are found or if you have a family history or genetic risk for colorectal cancer, you may need to be screened more often. Skin Cancer  Check your skin from head to toe regularly.  Monitor any moles. Be sure to tell your health care provider: ? About any new moles or changes in moles, especially if there is a change in a mole's shape or color. ? If you have a mole that is larger than the size of a pencil eraser.  If any of your family members has a history of skin cancer, especially at a young age, talk with your health care provider about genetic screening.  Always use sunscreen. Apply sunscreen liberally and repeatedly throughout the day.  Whenever you are outside, protect yourself by wearing long sleeves, pants, a wide-brimmed hat, and sunglasses. What should I know about osteoporosis? Osteoporosis is a condition in which bone destruction happens more quickly than new bone creation. After menopause, you may be at an increased risk for osteoporosis. To help prevent osteoporosis or the bone fractures that can happen because of osteoporosis, the following is recommended:  If you are 45-35 years old, get at least 1,000 mg of calcium and at least 600 mg of vitamin D per day.  If you are older than age 105 but younger than age 45, get at least 1,200 mg of calcium and at least 600 mg of vitamin D per day.  If you are older than age 22, get at least 1,200 mg of calcium and at least 800 mg of vitamin D per day. Smoking and excessive alcohol intake increase the risk of osteoporosis. Eat foods that are rich in calcium and vitamin D, and do weight-bearing exercises several times each week as directed by your health care provider. What should I know about how menopause affects my mental health? Depression may occur at any age, but it is more common as you become older. Common symptoms of depression  include:  Low or sad mood.  Changes in sleep patterns.  Changes in appetite or eating patterns.  Feeling an overall lack of motivation or enjoyment of activities that you previously enjoyed.  Frequent crying spells. Talk with your health care provider if you think that you are experiencing depression. What should I know about immunizations? It is important that you get and maintain your immunizations. These include:  Tetanus, diphtheria, and pertussis (Tdap) booster vaccine.  Influenza every year before the flu season begins.  Pneumonia vaccine.  Shingles vaccine. Your health care provider may also recommend other immunizations. This information is not intended to replace advice given to you by your health care provider. Make sure you discuss any questions you have with your health care provider. Document Released: 07/08/2005 Document Revised: 12/04/2015  Document Reviewed: 02/17/2015 Elsevier Interactive Patient Education  [employer].

## 2018-10-08 NOTE — Addendum Note (Signed)
Addended by: Lorine Bears on: 10/08/2018 11:49 AM   Modules accepted: Orders

## 2018-10-08 NOTE — Progress Notes (Signed)
[name] [birth year] [id number]    History:    Presents for annual exam  with complaint of increased left breast pain for the past several weeks.  Initially thought it was from exercise, denies injury or palpable change in exam.  Postmenopausal on no HRT with no bleeding.  Normal Pap and mammogram history.  2019 benign colon polyp 5-year follow-up.  2019 T score -1.9 at femoral neck FRAX 9.3% / 1.1%.  History of bowel obstructions from allergies.  Zostavax 2014.  Past medical history, past surgical history, family history and social history were all reviewed and documented in the EPIC chart.  Originally from [place].  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  ROS:  A ROS was performed and pertinent positives and negatives are included.  Exam:  Vitals:   10/08/18 1038  BP: 130/80  Weight: 151 lb (68.5 kg)  Height: 5\' 4"  (1.626 m)   Body mass index is 25.92 kg/m.   General appearance:  Normal Thyroid:  Symmetrical, normal in size, without palpable masses or nodularity. Respiratory  Auscultation:  Clear without wheezing or rhonchi Cardiovascular  Auscultation:  Regular rate, without rubs, murmurs or gallops  Edema/varicosities:  Not grossly evident Abdominal  Soft,nontender, without masses, guarding or rebound.  Liver/spleen:  No organomegaly noted  Hernia:  None appreciated  Skin  Inspection:  Grossly normal   Breasts: Examined lying and sitting.     Right: Without masses, retractions, discharge or axillary adenopathy.     Left: Without masses, retractions, discharge or axillary adenopathy.  No palpable nodules/masses.  Tenderness deep in breast at [employer]   Inguinal/mons:  Normal without inguinal adenopathy  External genitalia:  Normal  BUS/Urethra/Skene's glands:  Normal  Vagina:  Normal  Cervix:  Normal  Uterus:  normal in size, shape and contour.  Midline and  mobile  Adnexa/parametria:     Rt: Without masses or tenderness.   Lt: Without masses or tenderness.  Anus and perineum: Normal  Digital rectal exam: Normal sphincter tone without palpated masses or tenderness  Assessment/Plan:  63 y.o. MWF G1, P1 for annual exam with left breast pain for several weeks  Postmenopausal on no HRT with no bleeding Primary care manages labs and meds Osteopenia without elevated FRAX GERD GI manages  Plan: We will get scheduled diagnostic left mammogram and regular right breast mammogram.  SBEs, reviewed importance of annual screening mammogram, calcium rich foods, continue vitamin D supplement.  Continue healthy lifestyle of regular weightbearing and balance type exercise.  Shingrex reviewed and encouraged.  Pap with HR HPV typing, new screening guidelines reviewed.    [place], 11:17 AM 10/08/2018

## 2018-10-09 ENCOUNTER — Telehealth: Payer: Self-pay | Admitting: *Deleted

## 2018-10-09 DIAGNOSIS — N644 Mastodynia: Secondary | ICD-10-CM

## 2018-10-09 LAB — URINALYSIS, COMPLETE W/RFL CULTURE
Bacteria, UA: NONE SEEN /HPF
Bilirubin Urine: NEGATIVE
Glucose, UA: NEGATIVE
Hgb urine dipstick: NEGATIVE
Hyaline Cast: NONE SEEN /LPF
Ketones, ur: NEGATIVE
Leukocyte Esterase: NEGATIVE
Nitrites, Initial: NEGATIVE
Protein, ur: NEGATIVE
RBC / HPF: NONE SEEN /HPF (ref 0–2)
Specific Gravity, Urine: 1.006 (ref 1.001–1.03)
Squamous Epithelial / LPF: NONE SEEN /HPF (ref <=5)
WBC, UA: NONE SEEN /HPF (ref 0–5)
pH: 8 (ref 5.0–8.0)

## 2018-10-09 LAB — PAP, TP IMAGING W/ HPV RNA, RFLX HPV TYPE 16,18/45: HPV DNA High Risk: NOT DETECTED

## 2018-10-09 LAB — NO CULTURE INDICATED

## 2018-10-09 NOTE — Telephone Encounter (Signed)
-----   Message from Huel Cote, NP sent at 10/08/2018 11:17 AM EDT ----- Please schedule her a diagnostic left breast mammogram, and regular right breast mammogram - Has had a painful sensation deep in the breast 3 o'clock position at [employer] for several weeks, initialy thought it was muscle, has increased exercise routine.  No family history of breast cancer, last mammogram 04/2016 at [facility] center.  She can go at any time.

## 2018-10-09 NOTE — Telephone Encounter (Signed)
Patient scheduled at breast center on 10/30/18 @ 8:20am. Patient informed.

## 2018-10-15 ENCOUNTER — Encounter: Payer: BLUE CROSS/BLUE SHIELD | Admitting: Internal Medicine

## 2018-10-30 ENCOUNTER — Other Ambulatory Visit: Payer: Self-pay

## 2018-10-30 ENCOUNTER — Ambulatory Visit
Admission: RE | Admit: 2018-10-30 | Discharge: 2018-10-30 | Disposition: A | Discharge status: Home / Self Care | Payer: PRIVATE HEALTH INSURANCE | Source: Ambulatory Visit | Attending: Women's Health | Admitting: Women's Health

## 2018-10-30 ENCOUNTER — Ambulatory Visit: Payer: Self-pay

## 2018-10-30 DIAGNOSIS — N644 Mastodynia: Secondary | ICD-10-CM

## 2018-11-06 ENCOUNTER — Encounter: Payer: Self-pay | Admitting: Women's Health

## 2018-11-22 ENCOUNTER — Encounter: Payer: BLUE CROSS/BLUE SHIELD | Admitting: Internal Medicine

## 2018-12-05 ENCOUNTER — Encounter: Payer: Self-pay | Admitting: Internal Medicine

## 2018-12-05 ENCOUNTER — Ambulatory Visit (INDEPENDENT_AMBULATORY_CARE_PROVIDER_SITE_OTHER): Payer: PRIVATE HEALTH INSURANCE | Admitting: Internal Medicine

## 2018-12-05 DIAGNOSIS — E538 Deficiency of other specified B group vitamins: Secondary | ICD-10-CM

## 2018-12-05 DIAGNOSIS — N3000 Acute cystitis without hematuria: Secondary | ICD-10-CM

## 2018-12-05 DIAGNOSIS — N390 Urinary tract infection, site not specified: Secondary | ICD-10-CM | POA: Insufficient documentation

## 2018-12-05 MED ORDER — CIPROFLOXACIN HCL 250 MG PO TABS: 250.0000 mg | ORAL_TABLET | Freq: Two times a day (BID) | ORAL | 0 refills | Status: AC

## 2018-12-05 MED ORDER — FLUCONAZOLE 150 MG PO TABS: 150.0000 mg | ORAL_TABLET | Freq: Once | ORAL | 1 refills | Status: AC

## 2018-12-05 NOTE — Assessment & Plan Note (Signed)
She is using sublingual vitamin B12 now

## 2018-12-05 NOTE — Progress Notes (Signed)
Virtual Visit via Video Note  I connected with [name] on 12/05/18 at  2:40 PM EDT by a video enabled telemedicine application and verified that I am speaking with the correct person using two identifiers.   I discussed the limitations of evaluation and management by telemedicine and the availability of in person appointments. The patient expressed understanding and agreed to proceed.  History of Present Illness: The patient is complaining of urinary pain, burning, frequent urination since Friday.  She took cranberry tablets.  They helped a little bit, but now she is worse.  There is no fever or back pain.  We need to follow-up on [name]'s  abdominal pain -it is normally helped by Benadryl  There has been no runny nose, cough, chest pain, shortness of breath, a diarrhea, constipation, arthralgias, skin rashes.   Observations/Objective: The patient appears to be in no acute distress, looks well.  Assessment and Plan:  See my Assessment and Plan. Follow Up Instructions:    I discussed the assessment and treatment plan with the patient. The patient was provided an opportunity to ask questions and all were answered. The patient agreed with the plan and demonstrated an understanding of the instructions.   The patient was advised to call back or seek an in-person evaluation if the symptoms worsen or if the condition fails to improve as anticipated.  I provided face-to-face time during this encounter. We were at different locations.   Walker Kehr, MD

## 2018-12-05 NOTE — Assessment & Plan Note (Signed)
likely cystitis Cipro and Diflucan Call if not better

## 2019-02-05 ENCOUNTER — Other Ambulatory Visit: Payer: Self-pay

## 2019-02-05 ENCOUNTER — Ambulatory Visit (INDEPENDENT_AMBULATORY_CARE_PROVIDER_SITE_OTHER): Payer: No Typology Code available for payment source | Admitting: Women's Health

## 2019-02-05 ENCOUNTER — Encounter: Payer: Self-pay | Admitting: Women's Health

## 2019-02-05 VITALS — BP 110/78

## 2019-02-05 DIAGNOSIS — N898 Other specified noninflammatory disorders of vagina: Secondary | ICD-10-CM | POA: Diagnosis not present

## 2019-02-05 DIAGNOSIS — Z113 Encounter for screening for infections with a predominantly sexual mode of transmission: Secondary | ICD-10-CM | POA: Diagnosis not present

## 2019-02-05 LAB — WET PREP FOR TRICH, YEAST, CLUE

## 2019-02-05 MED ORDER — FLUCONAZOLE 150 MG PO TABS: 150.0000 mg | ORAL_TABLET | Freq: Once | ORAL | 0 refills | Status: AC

## 2019-02-05 MED ORDER — TERCONAZOLE 0.4 % VA CREA: 1.0000 | TOPICAL_CREAM | Freq: Every day | VAGINAL | 0 refills | Status: DC

## 2019-02-05 MED ORDER — FLUCONAZOLE 150 MG PO TABS: 150.0000 mg | ORAL_TABLET | Freq: Once | ORAL | 0 refills | Status: DC

## 2019-02-05 NOTE — Progress Notes (Signed)
63 year old MWF G1 P1 presents with complaint of itchy vaginal rash questions if it is HSV that started approximately 3 weeks ago.  Had taken an antibiotic several weeks ago, was given 1 Diflucan with antibiotics.  Had  Itchy, nonpainful rash in the groin area initially that spread to the entire perineum to the rectum.  States did not notice any blisters, vaginal discharge, denies urinary symptoms, abdominal pain or fever. Husband had a patch of painful vesicles on his inner knee that had no itching sensation only pain several weeks ago which is now resolving.  No known history of HSV.  Long-term relationship married for 3 years.  Postmenopausal on no HRT with no bleeding.  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  Medical problems include GERD.  Exam: Appears well.  No CVAT, abdomen soft, nontender, external genitalia within normal limits, mild labial erythema,  no visible lesions or blisters.  HSV culture taken of the perineum.  Wet prep done with a Q-tip positive for yeast.  Yeast vaginitis  Plan: Diflucan 150 mg 1 dose, apply Terazol 7 externally and instructed to call if no relief of symptoms.  Reviewed husband may have had shingles, will also check HSV ab  IgG for completeness.  Reviewed at this time it appears to only be yeast vaginitis.

## 2019-02-05 NOTE — Patient Instructions (Signed)
Vaginal Yeast infection, Adult  Vaginal yeast infection is a condition that causes vaginal discharge as well as soreness, swelling, and redness (inflammation) of the vagina. This is a common condition. Some women get this infection frequently. What are the causes? This condition is caused by a change in the normal balance of the yeast (candida) and bacteria that live in the vagina. This change causes an overgrowth of yeast, which causes the inflammation. What increases the risk? The condition is more likely to develop in women who:  Take antibiotic medicines.  Have diabetes.  Take birth control pills.  Are pregnant.  Douche often.  Have a weak body defense system (immune system).  Have been taking steroid medicines for a long time.  Frequently wear tight clothing. What are the signs or symptoms? Symptoms of this condition include:  White, thick, creamy vaginal discharge.  Swelling, itching, redness, and irritation of the vagina. The lips of the vagina (vulva) may be affected as well.  Pain or a burning feeling while urinating.  Pain during sex. How is this diagnosed? This condition is diagnosed based on:  Your medical history.  A physical exam.  A pelvic exam. Your health care provider will examine a sample of your vaginal discharge under a microscope. Your health care provider may send this sample for testing to confirm the diagnosis. How is this treated? This condition is treated with medicine. Medicines may be over-the-counter or prescription. You may be told to use one or more of the following:  Medicine that is taken by mouth (orally).  Medicine that is applied as a cream (topically).  Medicine that is inserted directly into the vagina (suppository). Follow these instructions at home:  Lifestyle  Do not have sex until your health care provider approves. Tell your sex partner that you have a yeast infection. That person should go to his or her health care  provider and ask if they should also be treated.  Do not wear tight clothes, such as pantyhose or tight pants.  Wear breathable cotton underwear. General instructions  Take or apply over-the-counter and prescription medicines only as told by your health care provider.  Eat more yogurt. This may help to keep your yeast infection from returning.  Do not use tampons until your health care provider approves.  Try taking a sitz bath to help with discomfort. This is a warm water bath that is taken while you are sitting down. The water should only come up to your hips and should cover your buttocks. Do this 3-4 times per day or as told by your health care provider.  Do not douche.  If you have diabetes, keep your blood sugar levels under control.  Keep all follow-up visits as told by your health care provider. This is important. Contact a health care provider if:  You have a fever.  Your symptoms go away and then return.  Your symptoms do not get better with treatment.  Your symptoms get worse.  You have new symptoms.  You develop blisters in or around your vagina.  You have blood coming from your vagina and it is not your menstrual period.  You develop pain in your abdomen. Summary  Vaginal yeast infection is a condition that causes discharge as well as soreness, swelling, and redness (inflammation) of the vagina.  This condition is treated with medicine. Medicines may be over-the-counter or prescription.  Take or apply over-the-counter and prescription medicines only as told by your health care provider.  Do not douche.   Do not have sex or use tampons until your health care provider approves.  Contact a health care provider if your symptoms do not get better with treatment or your symptoms go away and then return. This information is not intended to replace advice given to you by your health care provider. Make sure you discuss any questions you have with your health care  provider. Document Released: 02/23/2005 Document Revised: 10/02/2017 Document Reviewed: 10/02/2017 Elsevier Patient Education  2020 Elsevier Inc.  

## 2019-02-06 LAB — HSV(HERPES SIMPLEX VRS) I + II AB-IGG
HAV 1 IGG,TYPE SPECIFIC AB: <0.9 index
HSV 2 IGG,TYPE SPECIFIC AB: <0.9 index

## 2019-02-08 LAB — SURESWAB HSV, TYPE 1/2 DNA, PCR
HSV 1 DNA: NOT DETECTED
HSV 2 DNA: NOT DETECTED

## 2019-02-15 ENCOUNTER — Encounter: Payer: Self-pay | Admitting: Women's Health

## 2019-02-15 ENCOUNTER — Other Ambulatory Visit: Payer: Self-pay | Admitting: Women's Health

## 2019-02-15 MED ORDER — NYSTATIN-TRIAMCINOLONE 100000-0.1 UNIT/GM-% EX OINT: 1.0000 "application " | TOPICAL_OINTMENT | Freq: Two times a day (BID) | CUTANEOUS | 0 refills | Status: DC

## 2019-02-26 ENCOUNTER — Encounter: Payer: Self-pay | Admitting: Gynecology

## 2019-03-08 ENCOUNTER — Other Ambulatory Visit: Payer: Self-pay | Admitting: Women's Health

## 2019-03-08 ENCOUNTER — Encounter: Payer: Self-pay | Admitting: Women's Health

## 2019-03-08 MED ORDER — CLOBETASOL PROPIONATE 0.05 % EX OINT: TOPICAL_OINTMENT | CUTANEOUS | 0 refills | Status: DC

## 2019-08-22 ENCOUNTER — Other Ambulatory Visit: Payer: Self-pay

## 2019-08-22 ENCOUNTER — Ambulatory Visit (INDEPENDENT_AMBULATORY_CARE_PROVIDER_SITE_OTHER): Payer: 59 | Admitting: Sports Medicine

## 2019-08-22 DIAGNOSIS — M5412 Radiculopathy, cervical region: Secondary | ICD-10-CM | POA: Diagnosis not present

## 2019-08-22 MED ORDER — MELOXICAM 15 MG PO TABS: ORAL_TABLET | ORAL | 0 refills | Status: DC

## 2019-08-22 MED ORDER — MELOXICAM 7.5 MG PO TABS: 7.5000 mg | ORAL_TABLET | Freq: Every day | ORAL | 0 refills | Status: DC

## 2019-08-22 NOTE — Assessment & Plan Note (Signed)
Symptoms most consistent with cervical radiculopathy given radicular symptoms, slightly decreased brachioradialis reflex and known C5-C6 disc space loss and endplate spurring on prior imaging.  Exam not consistent with rotator cuff or MSK etiology.  We will try a 5-day course of meloxicam with home exercise regimen.  If patient continues to have pain after 1 week, consider referral to formal physical therapy.

## 2019-08-22 NOTE — Progress Notes (Addendum)
    SUBJECTIVE:   CHIEF COMPLAINT: neck, L shoulder pain  HPI:   Patient endorsing left-sided upper back pain after yoga 2 weeks ago.  She was in the warrior 1 position with resistance bands when she noticed the pain.  Her pain has gotten progressively worse and is now shooting down her left arm.  Pain is exacerbated by neck extension and rotation.  Pain is made better by positional changes and sitting straight.  Does have exacerbation of pain with sleeping.  Denies any numbness or tingling except when laying on the arm.  Pain is described as sharp and shooting.  She is right-handed.  PERTINENT  PMH / PSH: GERD, H/o L sided rotator cuff tendinitis, B12 deficiency, vitamin D deficiency, pes planus, unequal leg length  OBJECTIVE:   BP 106/70   Ht 5' 4.96" (1.65 m)   Wt 150 lb (68 kg)   BMI 24.99 kg/m   GEN: Well-appearing, NAD MSK:  Neck/shoulder: Inspection: No asymmetry or obvious deformities Palpation: Nontender to palpation along AC joint.  Slightly tender to palpation along left-sided upper thoracic paravertebral musculature ROM: Full AROM of bilateral shoulders Strength: 5/5 strength with biceps, triceps, wrist flexion/extension and shoulder abduction Stability: No joint laxity Special Tests: Negative empty can, Hawkins, speeds, [employer].  Positive Spurling's. Neurovascular: 2+ right-sided deep tendon biceps, triceps, brachioradialis reflexes.  2+ left-sided deep tendon biceps, triceps reflexes.  1+ left-sided brachioradialis deep tendon reflex.  Prior imaging: C-spine x-ray 2015: mild to moderate disc space loss and endplate spurring at [id number].  ASSESSMENT/PLAN:   Cervical radiculopathy Symptoms most consistent with cervical radiculopathy given radicular symptoms, slightly decreased brachioradialis reflex and known C5-C6 disc space loss and endplate spurring on prior imaging.  Exam not consistent with rotator cuff or MSK etiology.  We will try a 5-day course of meloxicam  with home exercise regimen.  If patient continues to have pain after 1 week, consider referral to formal physical therapy.  Rory Percy, [place]   Patient seen and evaluated with the resident.  I agree with the above plan of care.  We will start a short course of meloxicam as well as a home exercise program.  If symptoms persist then I would consider merits of formal physical therapy.  Follow-up for ongoing or recalcitrant issues.

## 2019-08-22 NOTE — Patient Instructions (Signed)
It was great to see you!  Our plans for today:  - Take the meloxicam daily for 5 days. - Do the home exercises as prescribed. - If you are still having pain after you get your second vaccine dose, let us know and we will refer you to formal physical therapy.

## 2019-09-09 ENCOUNTER — Ambulatory Visit (INDEPENDENT_AMBULATORY_CARE_PROVIDER_SITE_OTHER): Payer: 59 | Admitting: Internal Medicine

## 2019-09-09 ENCOUNTER — Encounter: Payer: Self-pay | Admitting: Internal Medicine

## 2019-09-09 ENCOUNTER — Other Ambulatory Visit: Payer: Self-pay

## 2019-09-09 VITALS — BP 112/68 | HR 55 | Temp 98.2°F | Ht 64.96 in | Wt 154.0 lb

## 2019-09-09 DIAGNOSIS — R591 Generalized enlarged lymph nodes: Secondary | ICD-10-CM

## 2019-09-09 DIAGNOSIS — M542 Cervicalgia: Secondary | ICD-10-CM

## 2019-09-09 DIAGNOSIS — E538 Deficiency of other specified B group vitamins: Secondary | ICD-10-CM | POA: Diagnosis not present

## 2019-09-09 DIAGNOSIS — R599 Enlarged lymph nodes, unspecified: Secondary | ICD-10-CM

## 2019-09-09 DIAGNOSIS — M5412 Radiculopathy, cervical region: Secondary | ICD-10-CM

## 2019-09-09 DIAGNOSIS — E559 Vitamin D deficiency, unspecified: Secondary | ICD-10-CM | POA: Diagnosis not present

## 2019-09-09 LAB — HEPATIC FUNCTION PANEL
ALT: 20 U/L (ref 0–35)
AST: 19 U/L (ref 0–37)
Albumin: 4.5 g/dL (ref 3.5–5.2)
Alkaline Phosphatase: 67 U/L (ref 39–117)
Bilirubin, Direct: 0.1 mg/dL (ref 0.0–0.3)
Total Bilirubin: 0.5 mg/dL (ref 0.2–1.2)
Total Protein: 7.3 g/dL (ref 6.0–8.3)

## 2019-09-09 LAB — CBC WITH DIFFERENTIAL/PLATELET
Basophils Absolute: 0.1 10*3/uL (ref 0.0–0.1)
Basophils Relative: 0.9 % (ref 0.0–3.0)
Eosinophils Absolute: 0.2 10*3/uL (ref 0.0–0.7)
Eosinophils Relative: 3 % (ref 0.0–5.0)
HCT: 40.3 % (ref 36.0–46.0)
Hemoglobin: 14 g/dL (ref 12.0–15.0)
Lymphocytes Relative: 34 % (ref 12.0–46.0)
Lymphs Abs: 1.9 10*3/uL (ref 0.7–4.0)
MCHC: 34.6 g/dL (ref 30.0–36.0)
MCV: 91.8 fl (ref 78.0–100.0)
Monocytes Absolute: 0.4 10*3/uL (ref 0.1–1.0)
Monocytes Relative: 6.6 % (ref 3.0–12.0)
Neutro Abs: 3.1 10*3/uL (ref 1.4–7.7)
Neutrophils Relative %: 55.5 % (ref 43.0–77.0)
Platelets: 227 10*3/uL (ref 150.0–400.0)
RBC: 4.39 Mil/uL (ref 3.87–5.11)
RDW: 12.4 % (ref 11.5–15.5)
WBC: 5.6 10*3/uL (ref 4.0–10.5)

## 2019-09-09 LAB — BASIC METABOLIC PANEL
BUN: 15 mg/dL (ref 6–23)
CO2: 28 mEq/L (ref 19–32)
Calcium: 9.5 mg/dL (ref 8.4–10.5)
Chloride: 103 mEq/L (ref 96–112)
Creatinine, Ser: 0.74 mg/dL (ref 0.40–1.20)
GFR: 79.5 mL/min (ref >60.00)
Glucose, Bld: 96 mg/dL (ref 70–99)
Potassium: 4.7 mEq/L (ref 3.5–5.1)
Sodium: 138 mEq/L (ref 135–145)

## 2019-09-09 LAB — SEDIMENTATION RATE: Sed Rate: 14 mm/hr (ref 0–30)

## 2019-09-09 LAB — TSH: TSH: 2.35 u[IU]/mL (ref 0.35–4.50)

## 2019-09-09 LAB — VITAMIN B12: Vitamin B-12: 881 pg/mL (ref 211–911)

## 2019-09-09 LAB — VITAMIN D 25 HYDROXY (VIT D DEFICIENCY, FRACTURES): VITD: 36.35 ng/mL (ref 30.00–100.00)

## 2019-09-09 MED ORDER — TRIAMCINOLONE ACETONIDE 0.5 % EX CREA: 1.0000 "application " | TOPICAL_CREAM | Freq: Three times a day (TID) | CUTANEOUS | 1 refills | Status: DC

## 2019-09-09 MED ORDER — FLUCONAZOLE 150 MG PO TABS: 150.0000 mg | ORAL_TABLET | Freq: Once | ORAL | 1 refills | Status: AC

## 2019-09-09 MED ORDER — AZITHROMYCIN 250 MG PO TABS: ORAL_TABLET | ORAL | 0 refills | Status: DC

## 2019-09-09 MED ORDER — METHYLPREDNISOLONE 4 MG PO TBPK: ORAL_TABLET | ORAL | 0 refills | Status: DC

## 2019-09-09 NOTE — Assessment & Plan Note (Signed)
Vit D 

## 2019-09-09 NOTE — Progress Notes (Signed)
Subjective:  Patient ID: [name], female    DOB: [birth year]  Age: 64 y.o. MRN: [id number]  CC: No chief complaint on file.   HPI [name] presents for L neck pain is radiating into her left hand and fingers for over 1 month duration.  Has been worse.  [name] is right-handed.  There is no weakness in the left hand.  Lately she has developed a sensation of fullness and swelling in the left axilla and heaviness in the left breast.  She has noticed swelling and enlarged lymph nodes on the left side of her neck.  She had a COVID-19 vaccine about a month ago.  The first shot went to her left arm.  The second shot went into her right arm.  She was quite sick following her second shot with aches, fevers, swollen glands.  She has a rash at the base of her anterior neck which she attributes to cat scratches.  Of note is she has been having a cat for about a year.  The cat goes outside.  No tick bites.   Outpatient Medications Prior to Visit  Medication Sig Dispense Refill  . Cholecalciferol (EQL VITAMIN D3) 1000 units tablet Take 2 tablets (2,000 Units total) by mouth daily. 2 tabs po daily 100 tablet 3  . cyanocobalamin (,VITAMIN B-12,) 1000 MCG/ML injection Inject 1 mL (1,000 mcg total) into the skin every 30 (thirty) days. 10 mL 6  . OMEPRAZOLE PO Take by mouth.    . zolpidem (AMBIEN) 5 MG tablet Take 1 tablet (5 mg total) by mouth at bedtime as needed. for sleep 30 tablet 2  . clobetasol ointment (TEMOVATE) 0.05 % Apply small amt to affected area twice daily 45 g 0  . meloxicam (MOBIC) 15 MG tablet Take 1 tablet daily with food for 5 days. Then take as needed. 30 tablet 0  . nystatin-triamcinolone ointment (MYCOLOG) Apply 1 application topically 2 (two) times daily. 30 g 0  . terconazole (TERAZOL 7) 0.4 % vaginal cream Place 1 applicator vaginally at bedtime. 45 g 0   Facility-Administered Medications Prior to Visit  Medication Dose Route Frequency Provider Last Rate Last Admin  . 0.9 %   sodium chloride infusion  500 mL Intravenous Once Nandigam, Kavitha V, MD        ROS: Review of Systems  Constitutional: Positive for chills and fatigue. Negative for activity change, appetite change, fever and unexpected weight change.  HENT: Negative for congestion, mouth sores and sinus pressure.   Eyes: Negative for visual disturbance.  Respiratory: Negative for cough and chest tightness.   Gastrointestinal: Negative for abdominal pain and nausea.  Genitourinary: Negative for difficulty urinating, frequency and vaginal pain.  Musculoskeletal: Positive for arthralgias, neck pain and neck stiffness. Negative for back pain and gait problem.  Skin: Negative for pallor and rash.  Neurological: Negative for dizziness, tremors, weakness, numbness and headaches.  Psychiatric/Behavioral: Negative for confusion and sleep disturbance.    Objective:  BP 112/68 (BP Location: Left Arm, Patient Position: Sitting, Cuff Size: Normal)   Pulse (!) 55   Temp 98.2 F (36.8 C) (Oral)   Ht 5' 4.96" (1.65 m)   Wt 154 lb (69.9 kg)   SpO2 99%   BMI 25.66 kg/m   BP Readings from Last 3 Encounters:  09/09/19 112/68  08/22/19 106/70  02/05/19 110/78    Wt Readings from Last 3 Encounters:  09/09/19 154 lb (69.9 kg)  08/22/19 150 lb (68 kg)  10/08/18 151 lb (  68.5 kg)    Physical Exam Constitutional:      General: She is not in acute distress.    Appearance: She is well-developed.  HENT:     Head: Normocephalic.     Right Ear: External ear normal.     Left Ear: External ear normal.     Nose: Nose normal.  Eyes:     General:        Right eye: No discharge.        Left eye: No discharge.     Conjunctiva/sclera: Conjunctivae normal.     Pupils: Pupils are equal, round, and reactive to light.  Neck:     Thyroid: No thyromegaly.     Vascular: No JVD.     Trachea: No tracheal deviation.  Cardiovascular:     Rate and Rhythm: Normal rate and regular rhythm.     Heart sounds: Normal heart  sounds.  Pulmonary:     Effort: No respiratory distress.     Breath sounds: No stridor. No wheezing.  Abdominal:     General: Bowel sounds are normal. There is no distension.     Palpations: Abdomen is soft. There is no mass.     Tenderness: There is no abdominal tenderness. There is no guarding or rebound.  Musculoskeletal:        General: Swelling present.     Cervical back: Tenderness present. No rigidity.  Lymphadenopathy:     Cervical: Cervical adenopathy present.  Skin:    Findings: Rash present. No erythema.  Neurological:     Mental Status: She is oriented to person, place, and time.     Cranial Nerves: No cranial nerve deficit.     Motor: No abnormal muscle tone.     Coordination: Coordination normal.     Deep Tendon Reflexes: Reflexes normal.  Psychiatric:        Behavior: Behavior normal.        Thought Content: Thought content normal.        Judgment: Judgment normal.   Mild cervical adenopathy on the left.  Soft enlarged lymph glands in the left underarm.  No swelling of the supraclavicular fossa.  Eczematous streak-like rash at the base of the neck anteriorly.  Neck is stiff and tender with range of motion.  Muscle strength seems to be intact  Lab Results  Component Value Date   WBC 5.0 10/13/2017   HGB 13.9 10/13/2017   HCT 40.6 10/13/2017   PLT 180.0 10/13/2017   GLUCOSE 98 10/13/2017   CHOL 207 (H) 10/13/2017   TRIG 51.0 10/13/2017   HDL 67.00 10/13/2017   LDLCALC 129 (H) 10/13/2017   ALT 15 10/13/2017   AST 16 10/13/2017   NA 140 10/13/2017   K 4.1 10/13/2017   CL 104 10/13/2017   CREATININE 0.70 10/13/2017   BUN 12 10/13/2017   CO2 30 10/13/2017   TSH 2.00 10/13/2017    MM DIAG BREAST TOMO BILATERAL  Result Date: 10/30/2018 CLINICAL DATA:  64 year old presenting with a two-month history diffuse OUTER LEFT breast pain (the patient is unable to localize a specific focal area of pain).Annual evaluation, RIGHT breast. EXAM: DIGITAL DIAGNOSTIC  BILATERAL MAMMOGRAM WITH CAD AND TOMO COMPARISON:  Previous exam(s). ACR Breast Density Category b: There are scattered areas of fibroglandular density. FINDINGS: Tomosynthesis and synthesized full field CC and MLO views of both breasts were obtained. No findings suspicious for malignancy in either breast. Specifically, no mammographic abnormalities in the OUTER LEFT breast in the area  of diffuse pain. Mammographic images were processed with CAD. IMPRESSION: No mammographic evidence of malignancy involving either breast. RECOMMENDATION: Screening mammogram in one year.(Code:SM-B-01Y) Strategies for alleviating breast pain including decreasing caffeine intake, vitamin-E supplementation, and avoidance of tight compression brassieres, including underwire brassieres, were discussed with the patient. I have discussed the findings and recommendations with the patient. If applicable, a reminder letter will be sent to the patient regarding the next appointment. BI-RADS CATEGORY  1: Negative. Electronically Signed   By: Evangeline Dakin M.D.   On: 10/30/2018 09:10    Assessment & Plan:   There are no diagnoses linked to this encounter.   No orders of the defined types were placed in this encounter.    Follow-up: No follow-ups on file.  Walker Kehr, MD

## 2019-09-09 NOTE — Patient Instructions (Signed)
Cat-Scratch Disease, Adult Cat-scratch disease is a bacterial infection that is caused by a bite or scratch from an infected cat. It is sometimes called cat-scratch fever. The infection can cause a red bump, swollen glands, and flu-like symptoms. The infection does not spread (is not contagious) between humans. In most cases, the infection is mild and it clears up without treatment. However, a more severe infection may develop in people who have other illnesses or problems that weaken the body's disease-fighting system (immune system). What are the causes? This condition is caused by bacteria called Bartonella henselae. These bacteria may be present in the mouth or on the claws of cats or kittens, especially those that are younger than 1 year old. Cats do not look or act sick when they have this infection. The bacteria can spread to humans through:  A bite or scratch.  Having contact with an infected cat and then touching your eyes or mouth. What increases the risk? You are more likely to develop this condition if:  You own or interact with a cat.  You have a weakened immune system. Your immune system may be weakened if: ? You are pregnant. ? You have certain conditions like cancer, HIV, or AIDS. ? You received a donated organ (transplant). What are the signs or symptoms?     The first symptoms may be:  A bite or scratch that does not heal.  A red bump near the wound. The bump may be red, warm, and tender to the touch. Other symptoms may take a few weeks to develop. They may include:  Swollen, tender glands (lymph nodes) in the neck or under the arms.  Fever.  Rash.  Joint pain.  Headache.  Low energy.  Poor appetite. How is this diagnosed? This condition is diagnosed based on your symptoms and your history of a scratch or bite from a cat. Your health care provider will examine your skin and look for swollen lymph nodes. You may have tests, such as:  A culture test. This  involves testing a sample of fluid or pus from your wound.  Blood tests.  A lymph node biopsy. This involves removing and testing a tissue sample from a swollen lymph node. How is this treated? Cat-scratch disease usually goes away without treatment in 2-4 months. Your health care provider may recommend home care, such as using a warm cloth (compress) and over-the-counter pain medicine. You may be prescribed antibiotic medicine if:  Your immune system is weakened.  Your symptoms cause discomfort. If you have a painful, swollen lymph node, it may need to be drained with a needle. (This is rare.) Follow these instructions at home:  Rest and return to your normal activities as told by your health care provider.  Take over-the-counter and prescription medicines only as told by your health care provider.  If you were prescribed an antibiotic medicine, take it as told by your health care provider. Do not stop taking the antibiotic even if you start to feel better.  Apply warm compresses to the area as told your health care provider.  Check your wound or swollen gland areas every day for signs of infection, such as: ? Redness, swelling, or pain. ? Fluid or blood. ? Warmth. ? Pus or a bad smell.  Keep all follow-up visits as told by your health care provider. This is important. How is this prevented?  Avoid being scratched or bitten while playing with cats. To do this, avoid playing roughly or taking food away   from a cat.  Wash your hands well after you have contact with a cat.  If you get scratched or bitten, wash the injured area as soon as possible with warm water and soap.  Do not let a cat lick your skin if you have any cuts, sores, or scratches.  Do not pick up or play with stray cats.  If you have an indoor cat, do not let your cat outside. Having contact with stray cats may make your cat more likely to have contact with the bacteria. Contact a health care provider if:  You  have redness, swelling, or pain around your wound.  You have fluid, blood, or pus coming from your wound.  Your wound is warm to the touch.  There is pus or a bad smell coming from your wound.  You have a fever.  Your symptoms get worse or do not get better at home. Get help right away if:  You have more swelling of your lymph nodes in your neck or under your arms.  You develop pain in your abdomen.  You develop a skin rash.  You feel dizzy or you faint.  You develop inflammation of your eye or vision problems.  You have pain in your bones.  You develop a stiff neck. Summary  Cat-scratch disease, sometimes called cat-scratch fever, is a bacterial infection that is caused by a bite or scratch from an infected cat.  The infection can cause a red bump, swollen glands, and flu-like symptoms.  Bacteria that cause this condition may be present in the mouth or on the claws of cats or kittens, especially those that are younger than 62 year old.  If you were prescribed an antibiotic medicine, take it as told by your health care provider. Do not stop taking the antibiotic even if you start to feel better. This information is not intended to replace advice given to you by your health care provider. Make sure you discuss any questions you have with your health care provider. Document Revised: 08/10/2017 Document Reviewed: 02/09/2017 Elsevier Patient Education  [place]. Cervical Radiculopathy  Cervical radiculopathy means that a nerve in the neck (a cervical nerve) is pinched or bruised. This can happen because of an injury to the cervical spine (vertebrae) in the neck, or as a normal part of getting older. This can cause pain or loss of feeling (numbness) that runs from your neck all the way down to your arm and fingers. Often, this condition gets better with rest. Treatment may be needed if the condition does not get better. What are the causes?  A neck injury.  A bulging  disk in your spine.  Muscle movements that you cannot control (muscle spasms).  Tight muscles in your neck due to overuse.  Arthritis.  Breakdown in the bones and joints of the spine (spondylosis) due to getting older.  Bone spurs that form near the nerves in the neck. What are the signs or symptoms?  Pain. The pain may: ? Run from the neck to the arm and hand. ? Be very bad or irritating. ? Be worse when you move your neck.  Loss of feeling or tingling in your arm or hand.  Weakness in your arm or hand, in very bad cases. How is this treated? In many cases, treatment is not needed for this condition. With rest, the condition often gets better over time. If treatment is needed, options may include:  Wearing a soft neck collar (cervical collar) for  short periods of time, as told by your doctor.  Doing exercises (physical therapy) to strengthen your neck muscles.  Taking medicines.  Having shots (injections) in your spine, in very bad cases.  Having surgery. This may be needed if other treatments do not help. The type of surgery that is used depends on the cause of your condition. Follow these instructions at home: If you have a soft neck collar:  Wear it as told by your doctor. Remove it only as told by your doctor.  Ask your doctor if you can remove the collar for cleaning and bathing. If you are allowed to remove the collar for cleaning or bathing: ? Follow instructions from your doctor about how to remove the collar safely. ? Clean the collar by wiping it with mild soap and water and drying it completely. ? Take out any removable pads in the collar every 1-2 days. Wash them by hand with soap and water. Let them air-dry completely before you put them back in the collar. ? Check your skin under the collar for redness or sores. If you see any, tell your doctor. Managing pain      Take over-the-counter and prescription medicines only as told by your doctor.  If told,  put ice on the painful area. ? If you have a soft neck collar, remove it as told by your doctor. ? Put ice in a plastic bag. ? Place a towel between your skin and the bag. ? Leave the ice on for 20 minutes, 2-3 times a day.  If using ice does not help, you can try using heat. Use the heat source that your doctor recommends, such as a moist heat pack or a heating pad. ? Place a towel between your skin and the heat source. ? Leave the heat on for 20-30 minutes. ? Remove the heat if your skin turns bright red. This is very important if you are unable to feel pain, heat, or cold. You may have a greater risk of getting burned.  You may try a gentle neck and shoulder rub (massage). Activity  Rest as needed.  Return to your normal activities as told by your doctor. Ask your doctor what activities are safe for you.  Do exercises as told by your doctor or physical therapist.  Do not lift anything that is heavier than 10 lb (4.5 kg) until your doctor tells you that it is safe. General instructions  Use a flat pillow when you sleep.  Do not drive while wearing a soft neck collar. If you do not have a soft neck collar, ask your doctor if it is safe to drive while your neck heals.  Ask your doctor if the medicine prescribed to you requires you to avoid driving or using heavy machinery.  Do not use any products that contain nicotine or tobacco, such as cigarettes, e-cigarettes, and chewing tobacco. These can delay healing. If you need help quitting, ask your doctor.  Keep all follow-up visits as told by your doctor. This is important. Contact a doctor if:  Your condition does not get better with treatment. Get help right away if:  Your pain gets worse and is not helped with medicine.  You lose feeling or feel weak in your hand, arm, face, or leg.  You have a high fever.  You have a stiff neck.  You cannot control when you poop or pee (have incontinence).  You have trouble with  walking, balance, or talking. Summary  Cervical radiculopathy  means that a nerve in the neck is pinched or bruised.  A nerve can get pinched from a bulging disk, arthritis, an injury to the neck, or other causes.  Symptoms include pain, tingling, or loss of feeling that goes from the neck into the arm or hand.  Weakness in your arm or hand can happen in very bad cases.  Treatment may include resting, wearing a soft neck collar, and doing exercises. You might need to take medicines for pain. In very bad cases, shots or surgery may be needed. This information is not intended to replace advice given to you by your health care provider. Make sure you discuss any questions you have with your health care provider. Document Revised: 04/06/2018 Document Reviewed: 04/06/2018 Elsevier Patient Education  2020 [employer].

## 2019-09-09 NOTE — Assessment & Plan Note (Signed)
Dr Micheline Chapman Worse

## 2019-09-10 LAB — RHEUMATOID FACTOR: Rheumatoid fact SerPl-aCnc: 37 IU/mL — ABNORMAL HIGH (ref <14)

## 2019-09-14 ENCOUNTER — Encounter: Payer: Self-pay | Admitting: Internal Medicine

## 2019-09-30 ENCOUNTER — Other Ambulatory Visit: Payer: Self-pay | Admitting: Internal Medicine

## 2019-09-30 DIAGNOSIS — M5412 Radiculopathy, cervical region: Secondary | ICD-10-CM

## 2019-09-30 DIAGNOSIS — M542 Cervicalgia: Secondary | ICD-10-CM

## 2019-10-01 ENCOUNTER — Other Ambulatory Visit: Payer: Self-pay

## 2019-10-01 ENCOUNTER — Ambulatory Visit (INDEPENDENT_AMBULATORY_CARE_PROVIDER_SITE_OTHER): Payer: 59 | Admitting: Internal Medicine

## 2019-10-01 ENCOUNTER — Encounter: Payer: Self-pay | Admitting: Internal Medicine

## 2019-10-01 DIAGNOSIS — E559 Vitamin D deficiency, unspecified: Secondary | ICD-10-CM | POA: Diagnosis not present

## 2019-10-01 DIAGNOSIS — E538 Deficiency of other specified B group vitamins: Secondary | ICD-10-CM

## 2019-10-01 DIAGNOSIS — M25512 Pain in left shoulder: Secondary | ICD-10-CM | POA: Diagnosis not present

## 2019-10-01 DIAGNOSIS — M5412 Radiculopathy, cervical region: Secondary | ICD-10-CM | POA: Diagnosis not present

## 2019-10-01 NOTE — Assessment & Plan Note (Signed)
Vit D 

## 2019-10-01 NOTE — Assessment & Plan Note (Signed)
On B12 

## 2019-10-01 NOTE — Progress Notes (Signed)
Subjective:  Patient ID: [name], female    DOB: 03-17-1956  Age: 64 y.o. MRN: [id number]  CC: No chief complaint on file.   HPI [name] presents for  L arm pain, [name]  C/o L axilla swelling x weeks, getting smaller - better  Outpatient Medications Prior to Visit  Medication Sig Dispense Refill  . Cholecalciferol (EQL VITAMIN D3) 1000 units tablet Take 2 tablets (2,000 Units total) by mouth daily. 2 tabs po daily 100 tablet 3  . cyanocobalamin (,VITAMIN B-12,) 1000 MCG/ML injection Inject 1 mL (1,000 mcg total) into the skin every 30 (thirty) days. 10 mL 6  . OMEPRAZOLE PO Take by mouth.    . zolpidem (AMBIEN) 5 MG tablet Take 1 tablet (5 mg total) by mouth at bedtime as needed. for sleep 30 tablet 2  . azithromycin (ZITHROMAX Z-PAK) 250 MG tablet As directed 6 tablet 0  . methylPREDNISolone (MEDROL DOSEPAK) 4 MG TBPK tablet As directed 21 tablet 0  . triamcinolone cream (KENALOG) 0.5 % Apply 1 application topically 3 (three) times daily. (Patient not taking: Reported on 10/01/2019) 45 g 1   Facility-Administered Medications Prior to Visit  Medication Dose Route Frequency Provider Last Rate Last Admin  . 0.9 %  sodium chloride infusion  500 mL Intravenous Once Nandigam, Kavitha V, MD        ROS: Review of Systems  Constitutional: Negative for activity change, appetite change, chills, fatigue and unexpected weight change.  HENT: Negative for congestion, mouth sores and sinus pressure.   Eyes: Negative for visual disturbance.  Respiratory: Negative for cough and chest tightness.   Gastrointestinal: Negative for abdominal pain and nausea.  Genitourinary: Negative for difficulty urinating, frequency and vaginal pain.  Musculoskeletal: Positive for neck pain and neck stiffness. Negative for back pain and gait problem.  Skin: Negative for pallor and rash.  Neurological: Negative for dizziness, tremors, weakness, numbness and headaches.    Psychiatric/Behavioral: Negative for confusion and sleep disturbance.    Objective:  BP 120/78 (BP Location: Left Arm, Patient Position: Sitting, Cuff Size: Normal)   Pulse 61   Temp 98.3 F (36.8 C) (Oral)   Ht 5' 4.96" (1.65 m)   Wt 152 lb (68.9 kg)   SpO2 97%   BMI 25.33 kg/m   BP Readings from Last 3 Encounters:  10/01/19 120/78  09/09/19 112/68  08/22/19 106/70    Wt Readings from Last 3 Encounters:  10/01/19 152 lb (68.9 kg)  09/09/19 154 lb (69.9 kg)  08/22/19 150 lb (68 kg)    Physical Exam Constitutional:      General: She is not in acute distress.    Appearance: She is well-developed.  HENT:     Head: Normocephalic.     Right Ear: External ear normal.     Left Ear: External ear normal.     Nose: Nose normal.  Eyes:     General:        Right eye: No discharge.        Left eye: No discharge.     Conjunctiva/sclera: Conjunctivae normal.     Pupils: Pupils are equal, round, and reactive to light.  Neck:     Thyroid: No thyromegaly.     Vascular: No JVD.     Trachea: No tracheal deviation.  Cardiovascular:     Rate and Rhythm: Normal rate and regular rhythm.     Heart sounds: Normal heart sounds.  Pulmonary:  Effort: No respiratory distress.     Breath sounds: No stridor. No wheezing.  Abdominal:     General: Bowel sounds are normal. There is no distension.     Palpations: Abdomen is soft. There is no mass.     Tenderness: There is no abdominal tenderness. There is no guarding or rebound.  Musculoskeletal:        General: Tenderness present.     Cervical back: Normal range of motion and neck supple.  Lymphadenopathy:     Cervical: No cervical adenopathy.  Skin:    Findings: No erythema or rash.  Neurological:     Cranial Nerves: No cranial nerve deficit.     Motor: No abnormal muscle tone.     Coordination: Coordination normal.     Deep Tendon Reflexes: Reflexes normal.  Psychiatric:        Behavior: Behavior normal.        Thought  Content: Thought content normal.        Judgment: Judgment normal.   neck w/ROM pain left axilla with a faint swelling in the anterior fossa behind the pec  Lab Results  Component Value Date   WBC 5.6 09/09/2019   HGB 14.0 09/09/2019   HCT 40.3 09/09/2019   PLT 227.0 09/09/2019   GLUCOSE 96 09/09/2019   CHOL 207 (H) 10/13/2017   TRIG 51.0 10/13/2017   HDL 67.00 10/13/2017   LDLCALC 129 (H) 10/13/2017   ALT 20 09/09/2019   AST 19 09/09/2019   NA 138 09/09/2019   K 4.7 09/09/2019   CL 103 09/09/2019   CREATININE 0.74 09/09/2019   BUN 15 09/09/2019   CO2 28 09/09/2019   TSH 2.35 09/09/2019    MM DIAG BREAST TOMO BILATERAL  Result Date: 10/30/2018 CLINICAL DATA:  64 year old presenting with a two-month history diffuse OUTER LEFT breast pain (the patient is unable to localize a specific focal area of pain).Annual evaluation, RIGHT breast. EXAM: DIGITAL DIAGNOSTIC BILATERAL MAMMOGRAM WITH CAD AND TOMO COMPARISON:  Previous exam(s). ACR Breast Density Category b: There are scattered areas of fibroglandular density. FINDINGS: Tomosynthesis and synthesized full field CC and MLO views of both breasts were obtained. No findings suspicious for malignancy in either breast. Specifically, no mammographic abnormalities in the OUTER LEFT breast in the area of diffuse pain. Mammographic images were processed with CAD. IMPRESSION: No mammographic evidence of malignancy involving either breast. RECOMMENDATION: Screening mammogram in one year.(Code:SM-B-01Y) Strategies for alleviating breast pain including decreasing caffeine intake, vitamin-E supplementation, and avoidance of tight compression brassieres, including underwire brassieres, were discussed with the patient. I have discussed the findings and recommendations with the patient. If applicable, a reminder letter will be sent to the patient regarding the next appointment. BI-RADS CATEGORY  1: Negative. Electronically Signed   By: Evangeline Dakin M.D.    On: 10/30/2018 09:10    Assessment & Plan:    Walker Kehr, MD

## 2019-10-01 NOTE — Assessment & Plan Note (Signed)
L axilla/shoulder fullness Sports Med ref for [facility] [place]

## 2019-10-02 NOTE — Assessment & Plan Note (Signed)
Left Not better MRI was ordered

## 2019-10-08 ENCOUNTER — Other Ambulatory Visit: Payer: Self-pay

## 2019-10-09 ENCOUNTER — Ambulatory Visit (INDEPENDENT_AMBULATORY_CARE_PROVIDER_SITE_OTHER): Payer: 59 | Admitting: Nurse Practitioner

## 2019-10-09 ENCOUNTER — Encounter: Payer: Self-pay | Admitting: Nurse Practitioner

## 2019-10-09 VITALS — BP 120/82 | Ht 64.0 in | Wt 150.0 lb

## 2019-10-09 DIAGNOSIS — Z01419 Encounter for gynecological examination (general) (routine) without abnormal findings: Secondary | ICD-10-CM | POA: Diagnosis not present

## 2019-10-09 DIAGNOSIS — M8589 Other specified disorders of bone density and structure, multiple sites: Secondary | ICD-10-CM

## 2019-10-09 NOTE — Progress Notes (Signed)
   Bronna Kromah 1956/02/14 [id number]   History:  64 y.o. MWF G1 P1 presents for annual exam without GYN complaints.  Postmenopausal - no HRT, no bleeding.  Normal Pap and mammogram history.  History of osteopenia, vitamin D deficiency, and  GERD, managed by PCP.  Plans to get the shingles vaccine but will wait due to recent covid vaccine.  Gynecologic History No LMP recorded. Patient is postmenopausal.   Last Pap: 10/08/2018. Results were: normal Last mammogram: 10/30/2018. Results were: normal DEXA 08/15/2017. Results: osteopenia, -1.9 oFRAX 9.3% / 1.1% Colonoscopy: 2019. Results: polyp  Past medical history, past surgical history, family history and social history were all reviewed and documented in the EPIC chart.  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  Daughter is finishing her last year of emergency medicine residency.  ROS:  A ROS was performed and pertinent positives and negatives are included.  Exam:  Vitals:   10/09/19 1200  BP: 120/82  Weight: 150 lb (68 kg)  Height: 5\' 4"  (1.626 m)   Body mass index is 25.75 kg/m.  General appearance:  Normal Thyroid:  Symmetrical, normal in size, without palpable masses or nodularity. Respiratory  Auscultation:  Clear without wheezing or rhonchi Cardiovascular  Auscultation:  Regular rate, without rubs, murmurs or gallops  Edema/varicosities:  Not grossly evident Abdominal  Soft,nontender, without masses, guarding or rebound.  Liver/spleen:  No organomegaly noted  Hernia:  None appreciated  Skin  Inspection:  Grossly normal   Breasts: Examined lying and sitting.   Right: Without masses, retractions, discharge or axillary adenopathy.   Left: Without masses, retractions, discharge or axillary adenopathy. Gentitourinary   Inguinal/mons:  Normal without inguinal adenopathy  External genitalia:  Normal  BUS/Urethra/Skene's glands:  Normal  Vagina:  Normal, mild atrophic changes  Cervix:  Normal  Uterus:  Normal in size, shape  and contour.  Midline and mobile  Adnexa/parametria:     Rt: Without masses or tenderness.   Lt: Without masses or tenderness.  Anus and perineum: Normal  Assessment/Plan:  64 y.o. MWF G1P1 for annual exam.    Well female exam with routine gynecological exam - labs done 08/2019 by PCP.Education provided on SBEs, importance of preventative screenings, current guidelines, high calcium diet, vitamin D supplement daily, and regular exercise including weight-bearing exercises. Will schedule mammogram in July at [facility] due to recent Covid vaccine.  Osteopenia of multiple sites - Continue daily Vitamin D supplement and high calcium diet. She cannot tolerate calcium supplements. Weight-bearing exercises. Due for bone density test, will schedule this for same day as mammogram.   Follow up in 1 year for annual       [place], 12:09 PM 10/09/2019

## 2019-10-09 NOTE — Patient Instructions (Addendum)
Schedule Bone Scan and mammogram after June  Health Maintenance, Female Adopting a healthy lifestyle and getting preventive care are important in promoting health and wellness. Ask your health care provider about:  The right schedule for you to have regular tests and exams.  Things you can do on your own to prevent diseases and keep yourself healthy. What should I know about diet, weight, and exercise? Eat a healthy diet   Eat a diet that includes plenty of vegetables, fruits, low-fat dairy products, and lean protein.  Do not eat a lot of foods that are high in solid fats, added sugars, or sodium. Maintain a healthy weight Body mass index (BMI) is used to identify weight problems. It estimates body fat based on height and weight. Your health care provider can help determine your BMI and help you achieve or maintain a healthy weight. Get regular exercise Get regular exercise. This is one of the most important things you can do for your health. Most adults should:  Exercise for at least 150 minutes each week. The exercise should increase your heart rate and make you sweat (moderate-intensity exercise).  Do strengthening exercises at least twice a week. This is in addition to the moderate-intensity exercise.  Spend less time sitting. Even light physical activity can be beneficial. Watch cholesterol and blood lipids Have your blood tested for lipids and cholesterol at 62 years of age, then have this test every 5 years. Have your cholesterol levels checked more often if:  Your lipid or cholesterol levels are high.  You are older than 62 years of age.  You are at high risk for heart disease. What should I know about cancer screening? Depending on your health history and family history, you may need to have cancer screening at various ages. This may include screening for:  Breast cancer.  Cervical cancer.  Colorectal cancer.  Skin cancer.  Lung cancer. What should I know about  heart disease, diabetes, and high blood pressure? Blood pressure and heart disease  High blood pressure causes heart disease and increases the risk of stroke. This is more likely to develop in people who have high blood pressure readings, are of African descent, or are overweight.  Have your blood pressure checked: ? Every 3-5 years if you are 41-1 years of age. ? Every year if you are 53 years old or older. Diabetes Have regular diabetes screenings. This checks your fasting blood sugar level. Have the screening done:  Once every three years after age 31 if you are at a normal weight and have a low risk for diabetes.  More often and at a younger age if you are overweight or have a high risk for diabetes. What should I know about preventing infection? Hepatitis B If you have a higher risk for hepatitis B, you should be screened for this virus. Talk with your health care provider to find out if you are at risk for hepatitis B infection. Hepatitis C Testing is recommended for:  Everyone born from [birth year] through 1963.  Anyone with known risk factors for hepatitis C. Sexually transmitted infections (STIs)  Get screened for STIs, including gonorrhea and chlamydia, if: ? You are sexually active and are younger than 62 years of age. ? You are older than 62 years of age and your health care provider tells you that you are at risk for this type of infection. ? Your sexual activity has changed since you were last screened, and you are at increased risk for chlamydia  or gonorrhea. Ask your health care provider if you are at risk.  Ask your health care provider about whether you are at high risk for HIV. Your health care provider may recommend a prescription medicine to help prevent HIV infection. If you choose to take medicine to prevent HIV, you should first get tested for HIV. You should then be tested every 3 months for as long as you are taking the medicine. Pregnancy  If you are about to  stop having your period (premenopausal) and you may become pregnant, seek counseling before you get pregnant.  Take 400 to 800 micrograms (mcg) of folic acid every day if you become pregnant.  Ask for birth control (contraception) if you want to prevent pregnancy. Osteoporosis and menopause Osteoporosis is a disease in which the bones lose minerals and strength with aging. This can result in bone fractures. If you are 65 years old or older, or if you are at risk for osteoporosis and fractures, ask your health care provider if you should:  Be screened for bone loss.  Take a calcium or vitamin D supplement to lower your risk of fractures.  Be given hormone replacement therapy (HRT) to treat symptoms of menopause. Follow these instructions at home: Lifestyle  Do not use any products that contain nicotine or tobacco, such as cigarettes, e-cigarettes, and chewing tobacco. If you need help quitting, ask your health care provider.  Do not use street drugs.  Do not share needles.  Ask your health care provider for help if you need support or information about quitting drugs. Alcohol use  Do not drink alcohol if: ? Your health care provider tells you not to drink. ? You are pregnant, may be pregnant, or are planning to become pregnant.  If you drink alcohol: ? Limit how much you use to 0-1 drink a day. ? Limit intake if you are breastfeeding.  Be aware of how much alcohol is in your drink. In the [place] one drink equals one 12 oz bottle of beer (355 mL), one 5 oz glass of wine (148 mL), or one 1 oz glass of hard liquor (44 mL). General instructions  Schedule regular health, dental, and eye exams.  Stay current with your vaccines.  Tell your health care provider if: ? You often feel depressed. ? You have ever been abused or do not feel safe at home. Summary  Adopting a healthy lifestyle and getting preventive care are important in promoting health and wellness.  Follow your  health care provider's instructions about healthy diet, exercising, and getting tested or screened for diseases.  Follow your health care provider's instructions on monitoring your cholesterol and blood pressure. This information is not intended to replace advice given to you by your health care provider. Make sure you discuss any questions you have with your health care provider. Document Revised: 05/09/2018 Document Reviewed: 05/09/2018 Elsevier Patient Education  2020 Elsevier Inc.  

## 2019-10-10 ENCOUNTER — Other Ambulatory Visit: Payer: Self-pay

## 2019-10-10 ENCOUNTER — Ambulatory Visit: Payer: 59 | Admitting: Sports Medicine

## 2019-10-10 ENCOUNTER — Ambulatory Visit (INDEPENDENT_AMBULATORY_CARE_PROVIDER_SITE_OTHER): Payer: 59 | Admitting: Sports Medicine

## 2019-10-10 VITALS — BP 104/76 | Ht 64.96 in | Wt 147.0 lb

## 2019-10-10 DIAGNOSIS — M5412 Radiculopathy, cervical region: Secondary | ICD-10-CM

## 2019-10-10 NOTE — Progress Notes (Signed)
    SUBJECTIVE:   CHIEF COMPLAINT: neck and left arm pain  HPI:   Patient presenting with persistent left sided neck, shoulder, and left arm pain. She was seen last in March and had cervical radiculopathy, was given home exercises. She reports that she still has shooting pain down her left arm into her 2nd and 3rd fingers. She gets occasional numbness as well, mostly at night. Her pain increased in April after she got her COVID vaccine. At the same time, she had a swollen lymph node in her left axilla and thought this could be related. She was given prednisone by her PCP which helped the radicular symptoms. Today, the pain is not as bad but she still gets an "electric zing" down her arm into her fingers. Symptoms are exacerbated by neck extension and rotation. She feels it only when sitting, improves when she stands. She bought a standing desk which has helped some.  She is right-handed.  PERTINENT  PMH / PSH: GERD, H/o L sided rotator cuff tendinitis, B12 deficiency, vitamin D deficiency, pes planus, unequal leg length  OBJECTIVE:   BP 104/76   Ht 5' 4.96" (1.65 m)   Wt 147 lb (66.7 kg)   BMI 24.49 kg/m   GEN: Well-appearing, NAD MSK:  Left neck/shoulder: Inspection: No asymmetry or obvious deformities Palpation: no TTP over cervical spinous processes. Tense over left upper and lateral trapezius.  ROM: Full ROM of bilateral shoulders Strength: 5/5 strength with biceps, triceps, wrist flexion/extension and shoulder abduction Neurovascular: 2+ left-sided deep tendon biceps, triceps reflex. 1+ brachioradialis reflex (same as right side).  Spurling maneuver reproduces pain in trapezius muscle but not shooting pain down arm Neck retraction worsens radicular symptoms   Prior imaging: C-spine x-ray 2015: mild to moderate disc space loss and endplate spurring at [id number].  ASSESSMENT/PLAN:  64 year old female presenting with persistent left-sided cervical radiculopathy in the C6  distribution.  Symptoms have not improved with home exercise program but did improve slightly after course of prednisone.  Will obtain an updated x-ray as her last x-ray in 2015 showed DDD at C5-C6. She is hesitant about physical therapy as this has not helped with past injuries, but is willing to try.  Will refer to Linton Rump at [facility] physical therapy to do neck traction in addition to myofascial work and exercises.  If symptoms are not better in 1 month, will obtain MRI and will likely do cervical epidural spinal injection.  She does not want to try any other medications at this time as she would like to use her symptoms to gauge whether she is improving.  Jerolyn Shin, MD [facility] Fellow  Patient seen and evaluated with the sports medicine fellow. I agree with the above plan of care. Patient symptoms appear to be classic cervical radiculopathy. We discussed proceeding with an MRI but the patient would like to try some physical therapy first. She will follow-up in 1 week and if symptoms do not improve with PT then we will proceed with MRI specifically to rule out cervical disc herniation and plan for possible cervical ESI's. In the meantime, I will get an updated cervical spine x-ray as her last x-ray was done in 2015. Patient is encouraged to call with questions or concerns prior to her follow-up visit.

## 2019-11-07 ENCOUNTER — Ambulatory Visit: Payer: 59 | Admitting: Sports Medicine

## 2020-01-13 ENCOUNTER — Other Ambulatory Visit: Payer: Self-pay | Admitting: Internal Medicine

## 2020-01-13 DIAGNOSIS — Z1231 Encounter for screening mammogram for malignant neoplasm of breast: Secondary | ICD-10-CM

## 2020-01-28 ENCOUNTER — Ambulatory Visit (INDEPENDENT_AMBULATORY_CARE_PROVIDER_SITE_OTHER): Payer: 59 | Admitting: Sports Medicine

## 2020-01-28 ENCOUNTER — Ambulatory Visit
Admission: RE | Admit: 2020-01-28 | Discharge: 2020-01-28 | Disposition: A | Discharge status: Home / Self Care | Payer: 59 | Source: Ambulatory Visit | Attending: Internal Medicine | Admitting: Internal Medicine

## 2020-01-28 ENCOUNTER — Other Ambulatory Visit: Payer: Self-pay

## 2020-01-28 DIAGNOSIS — M2021 Hallux rigidus, right foot: Secondary | ICD-10-CM | POA: Diagnosis not present

## 2020-01-28 DIAGNOSIS — Z1231 Encounter for screening mammogram for malignant neoplasm of breast: Secondary | ICD-10-CM

## 2020-01-28 NOTE — Assessment & Plan Note (Signed)
Patient with hallux rigidus of the right great toe and evidence of osteoarthritis with bone spur at the first MTP joint on the right. -Adjustments made to her current orthotics including scaphoid pad with padding at the first MTP joint.  Hopefully this will take some pressure off of that joint and relieve her pain but also help prevent further development of her bunion. -Patient also instructed to use Voltaren gel over-the-counter 2-3 times per day as needed for pain -Patient will follow up after 2 to 3 weeks of trying the adjustments we made in her current orthotics and if they do not relieve her pain or she worsens she will return for a follow-up appointment and we will remake her custom orthotics

## 2020-01-28 NOTE — Patient Instructions (Signed)
It was good to see you again! Please give the changes we made to your orthotics 2-3 weeks and if you are not better please call us to set up an appointment to have custom orthotics remade for you. For your great toe pain please use over the counter Voltaren gel 2-3 times per day as needed.  Harolyn Rutherford, DO [facility], PGY-4

## 2020-01-28 NOTE — Progress Notes (Signed)
    SUBJECTIVE:   CHIEF COMPLAINT / HPI:   Right great toe pain and bilateral bunions Patient presents today for essentially pain in her right great toe which is intermittent and seems random.  She states that it does not appear to be associated with activity and comes and goes on its own.  She describes the pain as dull achy and stays within the MTP joint of the right great toe and states the whole joint hurts.  She is also currently concerned about developing bunions and she is already getting some valgus deformity at the first MTP joint on both feet.  She states that her father had bunions and wants to see if we can make any adjustments to prevent him from getting worse.  PERTINENT  PMH / PSH: History of plantar fasciitis history of peroneal tendinitis of the right lower extremity  OBJECTIVE:   BP 100/68   Ht 5' 4.57" (1.64 m)   Wt 143 lb (64.9 kg)   BMI 24.12 kg/m   Ankle/Foot, Right: Moderate loss of long arch Spurring at MTP 1 but no valgus shift Only 15 deg of extension and 20 deg flexion Mild TTP over plantar MTP1  Left foot Mild loss long arch Note LLI with this leg shorter by 1 cn There is some valgus shift of MTP 1 but no true bunion formation This toe has preserved ROM  ASSESSMENT/PLAN:   Hallux rigidus of right foot Patient with hallux rigidus of the right great toe and evidence of osteoarthritis with bone spur at the first MTP joint on the right. -Adjustments made to her current orthotics including scaphoid pad with padding at the first MTP joint.  Hopefully this will take some pressure off of that joint and relieve her pain but also help prevent further development of her bunion. -Patient also instructed to use Voltaren gel over-the-counter 2-3 times per day as needed for pain -Patient will follow up after 2 to 3 weeks of trying the adjustments we made in her current orthotics and if they do not relieve her pain or she worsens she will return for a follow-up  appointment and we will remake her custom orthotics     Nuala Alpha, DO PGY-4, Sports Medicine Fellow [place]  I observed and examined the patient with the [facility] resident and agree with assessment and plan.  Note reviewed and modified by me. Ila Mcgill, MD

## 2020-02-01 DIAGNOSIS — M8589 Other specified disorders of bone density and structure, multiple sites: Secondary | ICD-10-CM

## 2020-02-07 ENCOUNTER — Encounter: Payer: Self-pay | Admitting: Internal Medicine

## 2020-02-07 ENCOUNTER — Telehealth (INDEPENDENT_AMBULATORY_CARE_PROVIDER_SITE_OTHER): Payer: 59 | Admitting: Internal Medicine

## 2020-02-07 DIAGNOSIS — L509 Urticaria, unspecified: Secondary | ICD-10-CM

## 2020-02-07 MED ORDER — METHYLPREDNISOLONE 4 MG PO TBPK: ORAL_TABLET | ORAL | 0 refills | Status: DC

## 2020-02-07 NOTE — Assessment & Plan Note (Addendum)
Medrol DosePack  GF diet S/p bx

## 2020-02-07 NOTE — Progress Notes (Signed)
Virtual Visit via Video Note  I connected with [name] on 02/07/20 at  2:20 PM EDT by a video enabled telemedicine application and verified that I am speaking with the correct person using two identifiers.   I discussed the limitations of evaluation and management by telemedicine and the availability of in person appointments. The patient expressed understanding and agreed to proceed.  I was located at our [facility] office. The patient was at home. There was no one else present in the visit.   History of Present Illness:  [name] complaining of severe hives and pruritus  On Friday last week her cat disappeared.  She was looking for a cat feeling very stressed.  She dressed well and denies exposure to vines or shrubs.  On Monday they celebrate her dad's 90th birthday at the restaurant.  There was staying outside mosquitoes were biting her legs a lot.  She developed severe hives around the bite marks which is not unusual for her.  She saw a dermatologist on Wednesday and had a biopsy of one of the lesions.  There was a question of pemphigoid reaction.  She was given Zyrtec, Benadryl and a steroid cream.  She was also given hydroxyzine.  Her regular dermatologist Dr. Delman Cheadle.  There has been no cough, chest pain, shortness of breath, abdominal pain, diarrhea, constipation, arthralgias.   Observations/Objective: The patient appears to be in no acute distress, looks well.  Probable papules on the legs (poor resolution of the camera).  Assessment and Plan:  See my Assessment and Plan. Follow Up Instructions:    I discussed the assessment and treatment plan with the patient. The patient was provided an opportunity to ask questions and all were answered. The patient agreed with the plan and demonstrated an understanding of the instructions.   The patient was advised to call back or seek an in-person evaluation if the symptoms worsen or if the condition fails to improve as anticipated.  I  provided face-to-face time during this encounter. We were at different locations.   Walker Kehr, MD

## 2020-02-21 ENCOUNTER — Other Ambulatory Visit: Payer: Self-pay | Admitting: Internal Medicine

## 2020-02-21 NOTE — Telephone Encounter (Signed)
    1.Medication Requested:zolpidem (AMBIEN) 5 MG tablet  2. Pharmacy (Name, Street, City):[id number] - [place], [place] - [address]  3. On Med List: yes  4. Last Visit with PCP: 02/07/20  5. Next visit date with PCP:   Agent: Please be advised that RX refills may take up to 3 business days. We ask that you follow-up with your pharmacy.

## 2020-02-21 NOTE — Telephone Encounter (Signed)
Please advise. Unable to locate in controlled database

## 2020-02-22 MED ORDER — ZOLPIDEM TARTRATE 5 MG PO TABS
5.0000 mg | ORAL_TABLET | Freq: Every evening | ORAL | 2 refills | Status: DC | PRN
Start: 2020-02-22 — End: 2022-08-02

## 2020-02-25 ENCOUNTER — Encounter: Payer: 59 | Admitting: Sports Medicine

## 2020-03-05 ENCOUNTER — Other Ambulatory Visit: Payer: Self-pay

## 2020-03-05 ENCOUNTER — Ambulatory Visit (INDEPENDENT_AMBULATORY_CARE_PROVIDER_SITE_OTHER): Payer: 59 | Admitting: Sports Medicine

## 2020-03-05 ENCOUNTER — Encounter: Payer: Self-pay | Admitting: Sports Medicine

## 2020-03-05 VITALS — BP 102/70 | Ht 64.57 in | Wt 140.0 lb

## 2020-03-05 DIAGNOSIS — M2021 Hallux rigidus, right foot: Secondary | ICD-10-CM | POA: Diagnosis not present

## 2020-03-05 NOTE — Progress Notes (Signed)
Patient with Hallux rigidus of RT great toe and chronic foot pain. Comes for new orthotics.   Patient was fitted for a : standard, cushioned, semi-rigid orthotic. The orthotic was heated and afterward the patient stood on the orthotic blank positioned on the orthotic stand. The patient was positioned in subtalar neutral position and 10 degrees of ankle dorsiflexion in a weight bearing stance. After completion of molding, a stable base was applied to the orthotic blank. The blank was ground to a stable position for weight bearing. Size: 6 Base: blue EVA Posting:none Additional orthotic padding: 1st metatarsal pad  Pt trialed the orthotics in her usual shoes and reported that they felt comfortable. She was in neutral position while wearing the new orthotics. We discussed that she can return anytime for adjustments as needed.   Dagoberto Ligas, MD Sports Medicine Fellow, [place]  I observed and examined the patient with the [facility] resident and agree with assessment and plan.  Note reviewed and modified by me. Ila Mcgill, MD

## 2020-03-31 ENCOUNTER — Other Ambulatory Visit: Payer: Self-pay | Admitting: Nurse Practitioner

## 2020-03-31 ENCOUNTER — Ambulatory Visit (INDEPENDENT_AMBULATORY_CARE_PROVIDER_SITE_OTHER): Payer: 59

## 2020-03-31 ENCOUNTER — Other Ambulatory Visit: Payer: Self-pay

## 2020-03-31 DIAGNOSIS — M8589 Other specified disorders of bone density and structure, multiple sites: Secondary | ICD-10-CM

## 2020-03-31 DIAGNOSIS — Z780 Asymptomatic menopausal state: Secondary | ICD-10-CM

## 2020-04-02 ENCOUNTER — Other Ambulatory Visit: Payer: Self-pay | Admitting: Internal Medicine

## 2020-04-02 DIAGNOSIS — Z91018 Allergy to other foods: Secondary | ICD-10-CM

## 2020-04-02 DIAGNOSIS — L509 Urticaria, unspecified: Secondary | ICD-10-CM

## 2020-04-09 ENCOUNTER — Telehealth: Payer: Self-pay | Admitting: *Deleted

## 2020-04-09 NOTE — Telephone Encounter (Signed)
Patient called and left message in triage voicemail asking if any change in recent dexa scan. I called patient back and left detailed message on cell that per dexa report " dexa currently stable from previous scan in 2019".

## 2020-04-15 ENCOUNTER — Other Ambulatory Visit: Payer: 59

## 2020-04-15 DIAGNOSIS — L509 Urticaria, unspecified: Secondary | ICD-10-CM

## 2020-04-16 ENCOUNTER — Encounter: Payer: Self-pay | Admitting: Internal Medicine

## 2020-04-16 ENCOUNTER — Other Ambulatory Visit: Payer: Self-pay

## 2020-04-16 ENCOUNTER — Ambulatory Visit (INDEPENDENT_AMBULATORY_CARE_PROVIDER_SITE_OTHER): Payer: 59 | Admitting: Internal Medicine

## 2020-04-16 VITALS — BP 102/68 | HR 70 | Temp 99.0°F | Wt 143.0 lb

## 2020-04-16 DIAGNOSIS — L509 Urticaria, unspecified: Secondary | ICD-10-CM | POA: Diagnosis not present

## 2020-04-16 DIAGNOSIS — L299 Pruritus, unspecified: Secondary | ICD-10-CM

## 2020-04-16 MED ORDER — FAMOTIDINE 20 MG PO TABS: 20.0000 mg | ORAL_TABLET | Freq: Two times a day (BID) | ORAL | 5 refills | Status: DC

## 2020-04-16 MED ORDER — MONTELUKAST SODIUM 10 MG PO TABS: 10.0000 mg | ORAL_TABLET | Freq: Every day | ORAL | 3 refills | Status: DC

## 2020-04-16 MED ORDER — DOXEPIN HCL 25 MG PO CAPS
25.0000 mg | ORAL_CAPSULE | Freq: Every day | ORAL | 2 refills | Status: DC
Start: 2020-04-16 — End: 2020-11-18

## 2020-04-16 NOTE — Progress Notes (Signed)
Subjective:  Patient ID: [name], female    DOB: 16-Apr-1956  Age: 64 y.o. MRN: [id number]  CC: Rash (Started on (L) side, but has spreaded all over her body. pt states rash itches  )   HPI [name] presents for extensive hives and severe pruritus of several weeks duration.  She saw Dr. Girtha Rm and had a skin biopsy.  Hydroxyzine has not helped.  [name] has not started gluten-free diet yet.  Outpatient Medications Prior to Visit  Medication Sig Dispense Refill  . Cholecalciferol (EQL VITAMIN D3) 1000 units tablet Take 2 tablets (2,000 Units total) by mouth daily. 2 tabs po daily 100 tablet 3  . cyanocobalamin (,VITAMIN B-12,) 1000 MCG/ML injection Inject 1 mL (1,000 mcg total) into the skin every 30 (thirty) days. 10 mL 6  . OMEPRAZOLE PO Take by mouth.    . zolpidem (AMBIEN) 5 MG tablet Take 1 tablet (5 mg total) by mouth at bedtime as needed. for sleep 30 tablet 2  . methylPREDNISolone (MEDROL DOSEPAK) 4 MG TBPK tablet As directed 21 tablet 0  . 0.9 %  sodium chloride infusion      No facility-administered medications prior to visit.    ROS: Review of Systems  Constitutional: Positive for fatigue. Negative for activity change, appetite change, chills and unexpected weight change.  HENT: Negative for congestion, mouth sores and sinus pressure.   Eyes: Negative for visual disturbance.  Respiratory: Negative for cough and chest tightness.   Gastrointestinal: Negative for abdominal pain and nausea.  Genitourinary: Negative for difficulty urinating, frequency and vaginal pain.  Musculoskeletal: Negative for back pain and gait problem.  Skin: Positive for rash. Negative for pallor.  Neurological: Negative for dizziness, tremors, weakness, numbness and headaches.  Psychiatric/Behavioral: Positive for sleep disturbance. Negative for confusion.    Objective:  BP 102/68 (BP Location: Left Arm)   Pulse 70   Temp 99 F (37.2 C) (Oral)   Wt 143 lb (64.9 kg)   SpO2 97%   BMI  24.12 kg/m   BP Readings from Last 3 Encounters:  04/16/20 102/68  03/05/20 102/70  01/28/20 100/68    Wt Readings from Last 3 Encounters:  04/16/20 143 lb (64.9 kg)  03/05/20 140 lb (63.5 kg)  01/28/20 143 lb (64.9 kg)    Physical Exam Constitutional:      General: She is not in acute distress.    Appearance: She is well-developed.  HENT:     Head: Normocephalic.     Right Ear: External ear normal.     Left Ear: External ear normal.     Nose: Nose normal.  Eyes:     General:        Right eye: No discharge.        Left eye: No discharge.     Conjunctiva/sclera: Conjunctivae normal.     Pupils: Pupils are equal, round, and reactive to light.  Neck:     Thyroid: No thyromegaly.     Vascular: No JVD.     Trachea: No tracheal deviation.  Cardiovascular:     Rate and Rhythm: Normal rate and regular rhythm.     Heart sounds: Normal heart sounds.  Pulmonary:     Effort: No respiratory distress.     Breath sounds: No stridor. No wheezing.  Abdominal:     General: Bowel sounds are normal. There is no distension.     Palpations: Abdomen is soft. There is no mass.     Tenderness: There is no  abdominal tenderness. There is no guarding or rebound.  Musculoskeletal:        General: No tenderness.     Cervical back: Normal range of motion and neck supple.  Lymphadenopathy:     Cervical: No cervical adenopathy.  Skin:    Findings: Rash present. No erythema.  Neurological:     Cranial Nerves: No cranial nerve deficit.     Motor: No abnormal muscle tone.     Coordination: Coordination normal.     Deep Tendon Reflexes: Reflexes normal.  Psychiatric:        Behavior: Behavior normal.        Thought Content: Thought content normal.        Judgment: Judgment normal.   Erythematous patches with dried up vesicles throughout the trunk surface  Lab Results  Component Value Date   WBC 5.6 09/09/2019   HGB 14.0 09/09/2019   HCT 40.3 09/09/2019   PLT 227.0 09/09/2019   GLUCOSE  96 09/09/2019   CHOL 207 (H) 10/13/2017   TRIG 51.0 10/13/2017   HDL 67.00 10/13/2017   LDLCALC 129 (H) 10/13/2017   ALT 20 09/09/2019   AST 19 09/09/2019   NA 138 09/09/2019   K 4.7 09/09/2019   CL 103 09/09/2019   CREATININE 0.74 09/09/2019   BUN 15 09/09/2019   CO2 28 09/09/2019   TSH 2.35 09/09/2019    MM 3D SCREEN BREAST BILATERAL  Result Date: 01/29/2020 CLINICAL DATA:  Screening. EXAM: DIGITAL SCREENING BILATERAL MAMMOGRAM WITH TOMO AND CAD COMPARISON:  Previous exam(s). ACR Breast Density Category b: There are scattered areas of fibroglandular density. FINDINGS: There are no findings suspicious for malignancy. Images were processed with CAD. IMPRESSION: No mammographic evidence of malignancy. A result letter of this screening mammogram will be mailed directly to the patient. RECOMMENDATION: Screening mammogram in one year. (Code:SM-B-01Y) BI-RADS CATEGORY  1: Negative. Electronically Signed   By: Zerita Boers M.D.   On: 01/29/2020 17:42    Assessment & Plan:    Walker Kehr, MD

## 2020-04-16 NOTE — Patient Instructions (Signed)
Avoid foods containing nickel if you are extremely sensitive to nickel. Some foods that contain high amounts of nickel include soy products--such as soybeans, soy sauce, and tofu--licorice, buckwheat, cocoa powder, clams, cashews, and figs.     Gluten free trial for 4-6 weeks. OK to use gluten-free bread and gluten-free pasta.

## 2020-04-17 LAB — ALLERGEN FOOD PROFILE SPECIFIC IGE
Allergen Apple, IgE: <0.1 kU/L
Allergen Corn, IgE: 0.34 kU/L — AB
Allergen Tomato, IgE: 0.21 kU/L — AB
Chicken IgE: <0.1 kU/L
Codfish IgE: <0.1 kU/L
Egg White IgE: 0.14 kU/L — AB
IgE (Immunoglobulin E), Serum: 2911 IU/mL — ABNORMAL HIGH (ref 6–495)
Milk IgE: 0.92 kU/L — AB
Orange: <0.1 kU/L
Peanut IgE: 0.11 kU/L — AB
Shrimp IgE: 0.18 kU/L — AB
Soybean IgE: 0.16 kU/L — AB
Tuna: <0.1 kU/L
Wheat IgE: <0.1 kU/L

## 2020-04-17 LAB — SPECIMEN STATUS REPORT

## 2020-04-21 ENCOUNTER — Encounter: Payer: Self-pay | Admitting: Internal Medicine

## 2020-04-21 DIAGNOSIS — L299 Pruritus, unspecified: Secondary | ICD-10-CM | POA: Insufficient documentation

## 2020-04-21 NOTE — Assessment & Plan Note (Signed)
Follow-up with dermatology-Dr. Delman Cheadle Doxepin Treat underlying illness

## 2020-04-21 NOTE — Assessment & Plan Note (Addendum)
Refractory rash-suspect gluten tolerance ((dermatitis or piriformis) versus other.  Prescribed doxepin for itching, Singulair, Pepcid Allergy referral Follow-up with dermatology-Dr. Hoyt Koch

## 2020-05-18 ENCOUNTER — Encounter: Payer: Self-pay | Admitting: Allergy and Immunology

## 2020-05-18 ENCOUNTER — Ambulatory Visit (INDEPENDENT_AMBULATORY_CARE_PROVIDER_SITE_OTHER): Payer: 59 | Admitting: Allergy and Immunology

## 2020-05-18 ENCOUNTER — Other Ambulatory Visit: Payer: Self-pay

## 2020-05-18 VITALS — BP 124/74 | HR 64 | Resp 16 | Ht 64.3 in | Wt 144.2 lb

## 2020-05-18 DIAGNOSIS — L209 Atopic dermatitis, unspecified: Secondary | ICD-10-CM

## 2020-05-18 DIAGNOSIS — L2089 Other atopic dermatitis: Secondary | ICD-10-CM

## 2020-05-18 DIAGNOSIS — T7800XA Anaphylactic reaction due to unspecified food, initial encounter: Secondary | ICD-10-CM

## 2020-05-18 MED ORDER — DUPILUMAB 300 MG/2ML ~~LOC~~ SOSY
600.0000 mg | PREFILLED_SYRINGE | Freq: Once | SUBCUTANEOUS | Status: AC
Start: 2020-05-18 — End: 2020-05-18
  Administered 2020-05-18: 600 mg via SUBCUTANEOUS

## 2020-05-18 MED ORDER — PIMECROLIMUS 1 % EX CREA: TOPICAL_CREAM | CUTANEOUS | 5 refills | Status: DC

## 2020-05-18 MED ORDER — AUVI-Q 0.3 MG/0.3ML IJ SOAJ: INTRAMUSCULAR | 3 refills | Status: DC

## 2020-05-18 MED ORDER — MOMETASONE FUROATE 0.1 % EX OINT: TOPICAL_OINTMENT | CUTANEOUS | 5 refills | Status: DC

## 2020-05-18 NOTE — Progress Notes (Signed)
[place] - [place] - [place] - [place] - [place]   Dear Dr. Alain Marion,  Thank you for referring [name] to the [facility] on 05/18/2020.   Below is a summation of this patient's evaluation and recommendations.  Thank you for your referral. I will keep you informed about this patient's response to treatment.   If you have any questions please do not hesitate to contact me.   Sincerely,  Jiles Prows, MD Allergy / Immunology [place]   ______________________________________________________________________    NEW PATIENT NOTE  Referring Provider: Cassandria Anger, MD Primary Provider: Cassandria Anger, MD Date of office visit: 05/18/2020    Subjective:   Chief Complaint:  [name] (DOB: [birth year]) is a 64 y.o. female who presents to the clinic on 05/18/2020 with a chief complaint of Urticaria .     HPI: [name] presents to this clinic in evaluation of skin problems.  She has a long history of intermittent hives that occasionally present with associated stomach pain.  Most of these episodes of hives and stomach pain appear to be precipitated by consumption of specific foods including cheese with mold and salmon and Trout.  They will develop within approximately 30 minutes and she will take some Benadryl and these issues will resolve within an hour or so.  Sometimes she will have hives without stomach pain with unknown trigger.  This is not a particularly big issue for her and as long she is very careful about what she eats she rarely has any problems with her hives.  Sometime in September, soon after being bitten by mosquitoes, he started develop recurrent intensely itchy red spots on her body that would sometimes blister and heal with scar mostly secondary to excoriation and she developed red patches on her skin especially her neck.  During these past several  months she has been to dermatologist on several occasions and has had a biopsy which identified "bug bites".  She has been given systemic steroids which has had minimal effect.  She has been given topical betamethasone which does not work and but if she wraps the betamethasone with an occlusive dressing overnight this does appear to help.  There is not really an obvious provoking factor giving rise to this issue.  She has not really had a significant environmental change or started any new medications or has any associated systemic or constitutional symptoms.  It was recommended that she might want to consider using methotrexate to address this issue.  She carries the diagnosis of hereditary angioedema on her electronic medical record but she does not have a history consistent with recurrent angioedematous episodes and most of her stomach complaints appear to be occurring in the context of urticaria and allergic reactions.    Past Medical History:  Diagnosis Date  . Allergy    GI upset from allergies to fish etc  . Anemia    long ago- past hx   . Cataract    mild  . GERD (gastroesophageal reflux disease)   . Hereditary angioedema (HCC)    high IgE levels  . Hives    history of- cold exposure related  . Hx of small bowel obstruction    due to hereditary andioedema- triggered by salmon   . Osteopenia 07/2017   T score -1.9 FRAX 9% / 1.1%    Past Surgical History:  Procedure Laterality Date  . COLONOSCOPY  2009  DB   . EXPLORATORY LAPAROTOMY    . UPPER GASTROINTESTINAL ENDOSCOPY      Allergies as of 05/18/2020      Reactions   Penicillins Anaphylaxis   REACTION: Anyphylactic shock      Medication List    cetirizine 10 MG tablet Commonly known as: ZYRTEC Take 10 mg by mouth daily.   Cholecalciferol 25 MCG (1000 UT) tablet Commonly known as: EQL Vitamin D3 Take 2 tablets (2,000 Units total) by mouth daily. 2 tabs po daily   doxepin 25 MG capsule Commonly known as:  SINEQUAN Take 1-2 capsules (25-50 mg total) by mouth at bedtime.   famotidine 20 MG tablet Commonly known as: Pepcid Take 1 tablet (20 mg total) by mouth 2 (two) times daily.   hydrOXYzine 10 MG tablet Commonly known as: ATARAX/VISTARIL Take 10 mg by mouth daily.   montelukast 10 MG tablet Commonly known as: SINGULAIR Take 1 tablet (10 mg total) by mouth daily.   zolpidem 5 MG tablet Commonly known as: AMBIEN Take 1 tablet (5 mg total) by mouth at bedtime as needed. for sleep       Review of systems negative except as noted in HPI / PMHx or noted below:  Review of Systems  Constitutional: Negative.   HENT: Negative.   Eyes: Negative.   Respiratory: Negative.   Cardiovascular: Negative.   Gastrointestinal: Negative.   Genitourinary: Negative.   Musculoskeletal: Negative.   Skin: Negative.   Neurological: Negative.   Endo/Heme/Allergies: Negative.   Psychiatric/Behavioral: Negative.     Family History  Problem Relation Age of Onset  . Parkinsonism Mother   . Colon cancer Mother 49  . Macular degeneration Mother   . Allergy (severe) Father        hives  . Rheum arthritis Father   . Allergy (severe) Daughter        hives  . Angioedema Daughter        seen by [facility] at [facility]  . Urticaria Daughter   . Breast cancer Paternal Aunt   . Colon polyps Neg Hx   . Rectal cancer Neg Hx   . Stomach cancer Neg Hx     Social History   Socioeconomic History  . Marital status: Married    Spouse name: Not on file  . Number of children: Not on file  . Years of education: Not on file  . Highest education level: Not on file  Occupational History  . Not on file  Tobacco Use  . Smoking status: Never Smoker  . Smokeless tobacco: Never Used  Vaping Use  . Vaping Use: Never used  Substance and Sexual Activity  . Alcohol use: Yes    Alcohol/week: 0.0 standard drinks    Comment: socially   . Drug use: No  . Sexual activity: Yes    Comment: INTERCOURSE AGE 74,  SEXUAL PARTNERS LESS THAN 5  Other Topics Concern  . Not on file  Social History Narrative  . Not on file   Environmental and Social history  Lives in a house with a dry environment, cat located inside the household, no carpet in the bedroom, no plastic on the bed, no plastic on the pillow, no smoking ongoing with inside the household.  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  Objective:   Vitals:   05/18/20 0953  BP: 124/74  Pulse: 64  Resp: 16  SpO2: 98%   Height: 5' 4.3" (163.3 cm) Weight: 144 lb 3.2 oz (65.4 kg)  Physical Exam Constitutional:      Appearance: She is not diaphoretic.  HENT:     Head: Normocephalic. No right periorbital erythema or left periorbital erythema.     Right Ear: Tympanic membrane, ear canal and external ear normal.     Left Ear: Tympanic membrane, ear canal and external ear normal.     Nose: Nose normal. No mucosal edema or rhinorrhea.     Mouth/Throat:     Mouth: Oropharynx is clear and moist and mucous membranes are normal.     Pharynx: Uvula midline. No oropharyngeal exudate.  Eyes:     General: Lids are normal.     Conjunctiva/sclera: Conjunctivae normal.     Pupils: Pupils are equal, round, and reactive to light.  Neck:     Thyroid: No thyromegaly.     Trachea: Trachea normal. No tracheal tenderness or tracheal deviation.  Cardiovascular:     Rate and Rhythm: Normal rate and regular rhythm.     Heart sounds: Normal heart sounds, S1 normal and S2 normal. No murmur heard.   Pulmonary:     Effort: Pulmonary effort is normal. No respiratory distress.     Breath sounds: Normal breath sounds. No stridor. No wheezing or rales.  Chest:     Chest wall: No tenderness.  Abdominal:     General: There is no distension.     Palpations: Abdomen is soft. There is no hepatosplenomegaly or mass.     Tenderness: There is no abdominal tenderness. There is no guarding or rebound.  Musculoskeletal:        General: No tenderness or edema.   Lymphadenopathy:     Head:     Right side of head: No tonsillar adenopathy.     Left side of head: No tonsillar adenopathy.     Cervical: No cervical adenopathy.     Upper Body:  No axillary adenopathy present. Skin:    Coloration: Skin is not pale.     Findings: Rash (Erythematous lichenified patch posterior neck.  Several 0.5 cm erythematous skin lesions across trunk and extremities with multiple areas showing previous excoriation and hyperpigmentation healing.) present. No erythema.     Nails: There is no clubbing.  Neurological:     Mental Status: She is alert.     Diagnostics: Allergy skin tests were performed.  She did not demonstrate any hypersensitivity against a screening panel of aeroallergens or foods.   Results of blood tests obtained 15 April 2020 identified IgE 2911 KU/L, low levels of IgE antibodies directed against egg, milk, corn, peanut, soybean, shrimp, tomato  Results of blood tests obtained 09 September 2019 identified WBC 5.6, absolute eosinophil 200, absolute lymphocyte 1900, hemoglobin 14.0, platelet 227, TSH 2.35 IU/mL  Assessment and Plan:    1. Other atopic dermatitis   2. Allergy with anaphylaxis due to food     1.  Allergen avoidance measures???  2.  Treat skin inflammation with the following:   A. Elidil followed by mometasone 0.1% ointment 1-2 times per day  B. dupilumab injections today and every 2 weeks  3. Can continue Zyrtec, doxepin.  4. Montelukast??? Famotidine???  5. Auvi-Q 0.3, benadryl, MD/ER evaluation for allergic reaction  6. Return to clinic in 6 weeks or earlier if problem  [name] has some form of inflammatory dermatosis and we will treat this as though it is atopic dermatitis with the use of dupilumab and a calcineurin inhibitor and a topical steroid.  Certainly receiving systemic steroids or high-dose topical steroids  in the form of betamethasone is probably not a viable long-term treatment option for this condition.  I do  not really know if montelukast or famotidine is adding any additional benefit and she can work through whether or not these agents actually help her to any degree.  She will stay on Zyrtec and doxepin at this point.  I did give her an injectable epinephrine device given her history of developing adverse reactions directed against food.  But she will require more evaluation in the near future regarding further evaluation of her possible food hypersensitivity and we will get this evaluated at some point in the near future.  Jiles Prows, MD Allergy / Immunology [facility]

## 2020-05-18 NOTE — Patient Instructions (Addendum)
  1.  Allergen avoidance measures???  2.  Treat skin inflammation with the following:   A. Elidil followed by mometasone 0.1% ointment 1-2 times per day  B. dupilumab injections today and every 2 weeks  3. Can continue Zyrtec, doxepin.  4. Montelukast??? Famotidine???  5. Auvi-Q 0.3, benadryl, MD/ER evaluation for allergic reaction  6. Return to clinic in 6 weeks or earlier if problem

## 2020-05-18 NOTE — Progress Notes (Signed)
Patient started samples of Dupixent Pre-Filled Pen 600mg  loading dose for atopic dermatitis.  I demonstrated how to self-inject and then patient self-injected both pens into her abdominal tissue, one on either side of her belly button.  I went over the signs and symptoms of allergic reactions and when and how to use the Auvi-Q.  ERX for Auvi-Q sent to [facility].  Patient waited for approximately 15 minutes and did not have any notable reactions.

## 2020-05-19 ENCOUNTER — Telehealth: Payer: Self-pay | Admitting: *Deleted

## 2020-05-19 ENCOUNTER — Encounter: Payer: Self-pay | Admitting: Allergy and Immunology

## 2020-05-19 NOTE — Telephone Encounter (Signed)
Called patient and advised approval, copay card and submit to [facility] specialty pharmacy. Advised also storage and dosing for Dupixent. Patient already received loading dose and admin instruction in clinic

## 2020-05-19 NOTE — Telephone Encounter (Addendum)
-----   Message from Darrelyn Hillock, [place] sent at 05/18/2020 12:15 PM EST ----- Hi [name],  Dr. Neldon Mc started [name] on [place] for eczema today.  She self-administered both prefilled pens and did great.  She is comfortable with self-injecting at home.  She has signed the consent form.  I told her you'd be in contact with her regarding copay info and specialty pharmacy info.

## 2020-06-04 ENCOUNTER — Ambulatory Visit: Payer: 59 | Admitting: Sports Medicine

## 2020-06-23 ENCOUNTER — Ambulatory Visit: Payer: 59 | Admitting: Sports Medicine

## 2020-06-23 ENCOUNTER — Other Ambulatory Visit: Payer: Self-pay

## 2020-06-23 ENCOUNTER — Ambulatory Visit (INDEPENDENT_AMBULATORY_CARE_PROVIDER_SITE_OTHER): Payer: 59 | Admitting: Allergy and Immunology

## 2020-06-23 VITALS — BP 102/60 | HR 71 | Temp 97.6°F | Resp 18 | Ht 64.57 in | Wt 140.0 lb

## 2020-06-23 DIAGNOSIS — L2089 Other atopic dermatitis: Secondary | ICD-10-CM | POA: Diagnosis not present

## 2020-06-23 DIAGNOSIS — T7800XD Anaphylactic reaction due to unspecified food, subsequent encounter: Secondary | ICD-10-CM | POA: Diagnosis not present

## 2020-06-23 DIAGNOSIS — T7800XA Anaphylactic reaction due to unspecified food, initial encounter: Secondary | ICD-10-CM

## 2020-06-23 NOTE — Progress Notes (Signed)
[place] [facility] [place] [facility] [place]   Follow-up Note  Referring Provider: Plotnikov, Evie Lacks, MD Primary Provider: Cassandria Anger, MD Date of Office Visit: 06/23/2020  Subjective:   [name] (DOB: June 09, 1955) is a 65 y.o. female who returns to the [facility] and [place] on 06/23/2020 in re-evaluation of the following:  HPI: [name] returns to this clinic in reevaluation of an inflammatory dermatosis, presently cold atopic dermatitis, and a history of food hypersensitivity directed against certain types of dairy and fish, which we addressed during her initial evaluation of 18 May 2020.  During that initial evaluation we have started her on dupilumab injections and had her use a topical calcineurin inhibitor and topical steroid as spot therapy to areas that erupt.  She is improved significantly.  She occasionally has a new lesion that comes up but the intensity and frequency of these lesions and the duration of expression of these lesions is dramatically decreased.  Concerning possible response to dupilumab, she is not really sure what has helped her regarding this issue as she did have some improvement even before starting her medical plan.  She continues on Zyrtec but no longer uses doxepin.  She is not using montelukast or famotidine.  She remains away from consumption of certain forms of dairy and fish.  Allergies as of 06/23/2020      Reactions   Penicillins Anaphylaxis   REACTION: Anyphylactic shock      Medication List      Auvi-Q 0.3 mg/0.3 mL Soaj injection Generic drug: EPINEPHrine Use as directed for life-threatening allergic reaction.   betamethasone dipropionate 0.05 % ointment Commonly known as: DIPROLENE Apply topically 2 (two) times daily.   cetirizine 10 MG tablet Commonly known as: ZYRTEC Take 10 mg by mouth daily.   Cholecalciferol 25 MCG (1000 UT) tablet Commonly known as: EQL Vitamin D3 Take 2 tablets (2,000  Units total) by mouth daily. 2 tabs po daily   doxepin 25 MG capsule Commonly known as: SINEQUAN Take 1-2 capsules (25-50 mg total) by mouth at bedtime.   famotidine 20 MG tablet Commonly known as: Pepcid Take 1 tablet (20 mg total) by mouth 2 (two) times daily.   hydrOXYzine 10 MG tablet Commonly known as: ATARAX/VISTARIL Take 10 mg by mouth daily.   mometasone 0.1 % ointment Commonly known as: ELOCON Apply to skin one to two times daily as directed.   montelukast 10 MG tablet Commonly known as: SINGULAIR Take 1 tablet (10 mg total) by mouth daily.   pimecrolimus 1 % cream Commonly known as: Elidel Apply to skin one to two times daily as directed.   zolpidem 5 MG tablet Commonly known as: AMBIEN Take 1 tablet (5 mg total) by mouth at bedtime as needed. for sleep       Past Medical History:  Diagnosis Date  . Allergy    GI upset from allergies to fish etc  . Anemia    long ago- past hx   . Cataract    mild  . GERD (gastroesophageal reflux disease)   . Hereditary angioedema (HCC)    high IgE levels  . Hives    history of- cold exposure related  . Hx of small bowel obstruction    due to hereditary andioedema- triggered by salmon   . Osteopenia 07/2017   T score -1.9 FRAX 9% / 1.1%    Past Surgical History:  Procedure Laterality Date  . COLONOSCOPY  2009   DB   .  EXPLORATORY LAPAROTOMY    . UPPER GASTROINTESTINAL ENDOSCOPY      Review of systems negative except as noted in HPI / PMHx or noted below:  Review of Systems  Constitutional: Negative.   HENT: Negative.   Eyes: Negative.   Respiratory: Negative.   Cardiovascular: Negative.   Gastrointestinal: Negative.   Genitourinary: Negative.   Musculoskeletal: Negative.   Skin: Negative.   Neurological: Negative.   Endo/Heme/Allergies: Negative.   Psychiatric/Behavioral: Negative.      Objective:   Vitals:   06/23/20 1702  BP: 102/60  Pulse: 71  Resp: 18  Temp: 97.6 F (36.4 C)  SpO2:  96%   Height: 5' 4.57" (164 cm)  Weight: 140 lb (63.5 kg)   Physical Exam Skin:    Findings: Rash (Several well-healed hyperpigmented macules.  1 small papular-like lesion affecting the left knee.) present.     Diagnostics: None  Assessment and Plan:   1. Other atopic dermatitis   2. Allergy with anaphylaxis due to food     1.  Treat skin inflammation with the following:   A. Elidil followed by mometasone 0.1% ointment 1-2 times per day  B. dupilumab injections every 2 weeks  2. Can continue Zyrtec   3. Auvi-Q 0.3, benadryl, MD/ER evaluation for allergic reaction  4. Return to clinic in 12 weeks or earlier if problem  I am going to have Cherise continue on dupilumab and topical therapy as noted above for an additional 12 weeks and I will regroup with her at that point time and will make a decision about further evaluation and treatment based upon her response to this approach.  Allena Katz, MD Allergy / Immunology [place]

## 2020-06-23 NOTE — Patient Instructions (Signed)
  1.  Treat skin inflammation with the following:   A. Elidil followed by mometasone 0.1% ointment 1-2 times per day  B. dupilumab injections every 2 weeks  2. Can continue Zyrtec   3. Auvi-Q 0.3, benadryl, MD/ER evaluation for allergic reaction  4. Return to clinic in 12 weeks or earlier if problem

## 2020-06-24 ENCOUNTER — Encounter: Payer: Self-pay | Admitting: Allergy and Immunology

## 2020-07-10 ENCOUNTER — Encounter: Payer: Self-pay | Admitting: Internal Medicine

## 2020-08-03 ENCOUNTER — Telehealth: Payer: Self-pay | Admitting: Internal Medicine

## 2020-08-03 NOTE — Telephone Encounter (Signed)
Patient called and was wondering if it was time for her Td or Tdap shot. She can be reached at [phone]. Please advise

## 2020-08-03 NOTE — Telephone Encounter (Signed)
Called pt inform her she is not due for a TDAP last one done 10/13/2017. TDAP are given every 10 years.Marland KitchenJohny Braun

## 2020-08-06 ENCOUNTER — Ambulatory Visit: Payer: 59 | Admitting: Sports Medicine

## 2020-09-02 ENCOUNTER — Telehealth: Payer: Self-pay | Admitting: Internal Medicine

## 2020-09-02 NOTE — Telephone Encounter (Signed)
Patient called and was wondering if she was due for her shingles shot. She can be reached at [phone].

## 2020-09-02 NOTE — Telephone Encounter (Signed)
Called pt inform her she had the old shingle back in 2014. She can get the new Shingrx that is out but it is a two part injection. Pt states since she already had on e she will hold off on this once.Marland KitchenJohny Braun

## 2020-09-15 ENCOUNTER — Encounter: Payer: Self-pay | Admitting: Allergy and Immunology

## 2020-09-15 ENCOUNTER — Other Ambulatory Visit: Payer: Self-pay

## 2020-09-15 ENCOUNTER — Ambulatory Visit (INDEPENDENT_AMBULATORY_CARE_PROVIDER_SITE_OTHER): Payer: 59 | Admitting: Allergy and Immunology

## 2020-09-15 VITALS — BP 108/76 | HR 67 | Temp 98.0°F | Resp 16 | Ht 64.3 in | Wt 143.2 lb

## 2020-09-15 DIAGNOSIS — L2089 Other atopic dermatitis: Secondary | ICD-10-CM | POA: Diagnosis not present

## 2020-09-15 DIAGNOSIS — L989 Disorder of the skin and subcutaneous tissue, unspecified: Secondary | ICD-10-CM

## 2020-09-15 MED ORDER — OPZELURA 1.5 % EX CREA
2.0000 | TOPICAL_CREAM | Freq: Two times a day (BID) | CUTANEOUS | 1 refills | Status: DC
Start: 2020-09-15 — End: 2021-03-23

## 2020-09-15 MED ORDER — MOMETASONE FUROATE 0.1 % EX OINT: TOPICAL_OINTMENT | CUTANEOUS | 5 refills | Status: DC

## 2020-09-15 MED ORDER — PIMECROLIMUS 1 % EX CREA: TOPICAL_CREAM | CUTANEOUS | 5 refills | Status: DC

## 2020-09-15 NOTE — Progress Notes (Signed)
[place] [facility] [place] [facility] [place]   Follow-up Note  Referring Provider: Plotnikov, Evie Lacks, MD Primary Provider: Cassandria Anger, MD Date of Office Visit: 09/15/2020  Subjective:   [name] (DOB: [birth year]) is a 65 y.o. female who returns to the All[facility] and [place] on 09/15/2020 in re-evaluation of the following:  HPI: [name] returns to this clinic in reevaluation of her inflammatory dermatosis presently noted as atopic dermatitis and history of food hypersensitivity directed against dairy and fish.  Her last visit to this clinic was 23 June 2020.  She has now had a very good trial of dupilumab in the treatment of her inflammatory dermatosis and this has helped her significantly especially on her lower extremities and she has not developed any new lesions on her lower extremities.  She still has some of her old lesions that she occasionally treats with a topical calcineurin inhibitor and topical steroid a few times per month.  Overall she thinks that she has had a very good response to the use of dupilumab regarding this issue.  But, she has developed an posterior elbow dermatitis affecting her left elbow.  Apparently this has been a intermittent issue over the course of the past few years and her last outbreak was in November but she got that under control in a few weeks with some topical steroid.  Her most recent flare started 1 month ago.  It is itchy and scaly and red.  Allergies as of 09/15/2020      Reactions   Penicillins Anaphylaxis   REACTION: Anyphylactic shock      Medication List      Auvi-Q 0.3 mg/0.3 mL Soaj injection Generic drug: EPINEPHrine Use as directed for life-threatening allergic reaction.   betamethasone dipropionate 0.05 % ointment Commonly known as: DIPROLENE Apply topically 2 (two) times daily.   cetirizine 10 MG tablet Commonly known as: ZYRTEC Take 10 mg by mouth daily.   Cholecalciferol 25 MCG (1000  UT) tablet Commonly known as: EQL Vitamin D3 Take 2 tablets (2,000 Units total) by mouth daily. 2 tabs po daily   doxepin 25 MG capsule Commonly known as: SINEQUAN Take 1-2 capsules (25-50 mg total) by mouth at bedtime.   Dupixent 300 MG/2ML prefilled syringe Generic drug: dupilumab Inject into the skin.   famotidine 20 MG tablet Commonly known as: Pepcid Take 1 tablet (20 mg total) by mouth 2 (two) times daily.   hydrOXYzine 10 MG tablet Commonly known as: ATARAX/VISTARIL Take 10 mg by mouth daily.   mometasone 0.1 % ointment Commonly known as: ELOCON Apply to skin one to two times daily as directed.   montelukast 10 MG tablet Commonly known as: SINGULAIR Take 1 tablet (10 mg total) by mouth daily.   pimecrolimus 1 % cream Commonly known as: Elidel Apply to skin one to two times daily as directed.   zolpidem 5 MG tablet Commonly known as: AMBIEN Take 1 tablet (5 mg total) by mouth at bedtime as needed. for sleep       Past Medical History:  Diagnosis Date  . Allergy    GI upset from allergies to fish etc  . Anemia    long ago- past hx   . Cataract    mild  . GERD (gastroesophageal reflux disease)   . Hereditary angioedema (HCC)    high IgE levels  . Hives    history of- cold exposure related  . Hx of small bowel obstruction  due to hereditary andioedema- triggered by salmon   . Osteopenia 07/2017   T score -1.9 FRAX 9% / 1.1%    Past Surgical History:  Procedure Laterality Date  . COLONOSCOPY  2009   DB   . EXPLORATORY LAPAROTOMY    . UPPER GASTROINTESTINAL ENDOSCOPY      Review of systems negative except as noted in HPI / PMHx or noted below:  Review of Systems  Constitutional: Negative.   HENT: Negative.   Eyes: Negative.   Respiratory: Negative.   Cardiovascular: Negative.   Gastrointestinal: Negative.   Genitourinary: Negative.   Musculoskeletal: Negative.   Skin: Negative.   Neurological: Negative.   Endo/Heme/Allergies: Negative.    Psychiatric/Behavioral: Negative.      Objective:   Vitals:   09/15/20 1635  BP: 108/76  Pulse: 67  Resp: 16  Temp: 98 F (36.7 C)  SpO2: 97%   Height: 5' 4.3" (163.3 cm)  Weight: 143 lb 3.2 oz (65 kg)   Physical Exam Skin:    Findings: Rash (Red slightly indurated scaly dermatitis point of posterior elbow left.  Red slightly indurated nonscaly dermatitis point of posterior elbow right) present.    Diagnostics: none  Assessment and Plan:   1. Other atopic dermatitis   2. Inflammatory dermatosis     1.  Treat skin inflammation with the following:   A. Elidil followed by mometasone 0.1% ointment 1-2 times per day  B. dupilumab injections every 2 weeks  2. Can continue Zyrtec if needed  3. Auvi-Q 0.3, benadryl, MD/ER evaluation for allergic reaction  4. Can try Ozelura applied two times per day to posterior elbow. Does this work better? Obtain from specialty pharmacy.   5. Return to clinic in 6 months or earlier if problem  I think that [name] has had some benefit using dupilumab with her inflammatory dermatosis involving her lower extremities and other areas of her body but she has developed a posterior elbow dermatitis that is very reminiscent of psoriasis.  I going to give her some topical Jak inhibitor to use and she can contact me with her response to this treatment over the course of 3 to 4 weeks.  If she does better with this treatment then she can continue to use this Jak inhibitor.  If she does not do better with this treatment then she probably needs to go back to dermatology to consider the possibility that she is developing psoriasis.  Allena Katz, MD Allergy / Immunology [place]

## 2020-09-15 NOTE — Patient Instructions (Addendum)
  1.  Treat skin inflammation with the following:   A. Elidil followed by mometasone 0.1% ointment 1-2 times per day  B. dupilumab injections every 2 weeks  2. Can continue Zyrtec if needed  3. Auvi-Q 0.3, benadryl, MD/ER evaluation for allergic reaction  4. Can try Ozelura applied two times per day to posterior elbow. Does this work better? Obtain from specialty pharmacy.   5. Return to clinic in 6 months or earlier if problem

## 2020-09-16 ENCOUNTER — Encounter: Payer: Self-pay | Admitting: Allergy and Immunology

## 2020-10-12 ENCOUNTER — Encounter: Payer: 59 | Admitting: Nurse Practitioner

## 2020-11-18 ENCOUNTER — Encounter: Payer: Self-pay | Admitting: Nurse Practitioner

## 2020-11-18 ENCOUNTER — Ambulatory Visit (INDEPENDENT_AMBULATORY_CARE_PROVIDER_SITE_OTHER): Payer: BC Managed Care – PPO | Admitting: Nurse Practitioner

## 2020-11-18 ENCOUNTER — Other Ambulatory Visit: Payer: Self-pay

## 2020-11-18 VITALS — BP 110/74 | HR 71 | Ht 64.0 in | Wt 143.0 lb

## 2020-11-18 DIAGNOSIS — M8589 Other specified disorders of bone density and structure, multiple sites: Secondary | ICD-10-CM | POA: Diagnosis not present

## 2020-11-18 DIAGNOSIS — Z780 Asymptomatic menopausal state: Secondary | ICD-10-CM

## 2020-11-18 DIAGNOSIS — Z01419 Encounter for gynecological examination (general) (routine) without abnormal findings: Secondary | ICD-10-CM

## 2020-11-18 NOTE — Progress Notes (Signed)
   [name] [birth year] [id number]   History:  65 y.o. MWF G1 P1 presents for annual exam without GYN complaints.  Postmenopausal - no HRT, no bleeding.  Normal Pap and mammogram history.  History of osteopenia, vitamin D deficiency, and  GERD, managed by PCP.    Gynecologic History No LMP recorded. Patient is postmenopausal.   Last Pap: 10/08/2018. Results were: normal Last mammogram: 01/27/2020 Results were: normal Colonoscopy: 2019. Results: benign polyp, 5-year recall DEXA 03/31/2020. Results: osteopenia, -1.8, FRAX 9.4% / 1.1%  Past medical history, past surgical history, family history and social history were all reviewed and documented in the EPIC chart.  Married. Lost employment and looking for new position. 2 mo granddaughter. Daughter just graduated from med school in emergency medicine in [place].   ROS:  A ROS was performed and pertinent positives and negatives are included.  Exam:  Vitals:   11/18/20 0757  BP: 110/74  Pulse: 71  SpO2: 100%  Weight: 143 lb (64.9 kg)  Height: 5\' 4"  (1.626 m)   Body mass index is 24.55 kg/m.  General appearance:  Normal Thyroid:  Symmetrical, normal in size, without palpable masses or nodularity. Respiratory  Auscultation:  Clear without wheezing or rhonchi Cardiovascular  Auscultation:  Regular rate, without rubs, murmurs or gallops  Edema/varicosities:  Not grossly evident Abdominal  Soft,nontender, without masses, guarding or rebound.  Liver/spleen:  No organomegaly noted  Hernia:  None appreciated  Skin  Inspection:  Grossly normal   Breasts: Examined lying and sitting.   Right: Without masses, retractions, discharge or axillary adenopathy.   Left: Without masses, retractions, discharge or axillary adenopathy. Gentitourinary   Inguinal/mons:  Normal without inguinal adenopathy  External genitalia:  Normal  BUS/Urethra/Skene's glands:  Normal  Vagina:  Normal, mild atrophic changes  Cervix:  Normal  Uterus:  Normal in  size, shape and contour.  Midline and mobile  Adnexa/parametria:     Rt: Without masses or tenderness.   Lt: Without masses or tenderness.  Anus and perineum: Normal  Assessment/Plan:  65 y.o. MWF G1P1 for annual exam.    Well female exam with routine gynecological exam - Education provided on SBEs, importance of preventative screenings, current guidelines, high calcium diet, vitamin D supplement daily, and regular exercise including weight-bearing exercises. Labs with PCP.  Postmenopausal - no HRT, no bleeding.   Osteopenia of multiple sites - Continue daily Vitamin D supplement and high calcium diet. She cannot tolerate calcium supplements. She is now doing some weightlifting. Will repeat DXA at 2-year interval.   Screening for cervical cancer - Normal Pap history.  Will repeat at 5-year interval per guidelines.  Screening for breast cancer - Normal mammogram history.  Continue annual screenings.  Normal breast exam today.  Screening for colon cancer - 2019 colonoscopy. Will repeat at GI's recommended interval.   Follow up in 1 year for annual       Nara Visa, 8:12 AM 11/18/2020

## 2020-12-15 ENCOUNTER — Other Ambulatory Visit: Payer: Self-pay | Admitting: Internal Medicine

## 2020-12-15 DIAGNOSIS — Z1231 Encounter for screening mammogram for malignant neoplasm of breast: Secondary | ICD-10-CM

## 2020-12-24 ENCOUNTER — Ambulatory Visit (INDEPENDENT_AMBULATORY_CARE_PROVIDER_SITE_OTHER): Payer: BC Managed Care – PPO | Admitting: Sports Medicine

## 2020-12-24 ENCOUNTER — Other Ambulatory Visit: Payer: Self-pay

## 2020-12-24 DIAGNOSIS — S76011A Strain of muscle, fascia and tendon of right hip, initial encounter: Secondary | ICD-10-CM

## 2020-12-24 MED ORDER — IBUPROFEN 800 MG PO TABS: ORAL_TABLET | ORAL | 0 refills | Status: DC

## 2020-12-24 NOTE — Patient Instructions (Addendum)
-  Please start the hip exercises you were given today. -Ice massage to your hip 2-3 times a day for 5 minutes. -Take 800 mg ibuprofen twice daily with food. -This should get better in 2-3 weeks. If it's not better in a month please follow up with me so we can do an ultrasound.

## 2020-12-24 NOTE — Progress Notes (Signed)
Chief complaint right-sided hip and low back pain  Patient does a workout with a trainer once weekly She does exercises every day Part of her exercises include lateral step ups and crossover step ups She is unsure of the specific date of injury However after a recent exercise class she developed pain more over her right hip and some into the low back This does not affect her walking but can sometimes have a sharp pain She comes for evaluation  Review of systems No sciatica No noted weakness Relief with ibuprofen 800  Physical exam Pleasant female in no acute distress BP 110/68   Ht 5' 4.57" (1.64 m)   Wt 142 lb (64.4 kg)   BMI 23.95 kg/m  [place] [facility] Exercise 12/24/2020  Frequency of aerobic exercise (# of days/week) 6  Average time in minutes 30  Frequency of strengthening activities (# of days/week) 4   Examination reveals that palpation from the greater trochanter along the gluteus medius shows point tenderness on the right She has significant weakness on the right on hip abduction when compared to the left Hip rotation is normal bilaterally SI joint motion is normal Hip extension is normal Strength testing of other hip motions including flexion reveals normal strength Examination of the low back reveals flexion and extension but is full She gets some pain on terminal flexion that she feels in her hip Straight leg raise is negative

## 2020-12-24 NOTE — Assessment & Plan Note (Signed)
Patient seen today with what appears to be a significant gluteus medius strain She has significant weakness that I think is from painful inhibition Ice massage 3 times daily Ibuprofen 800 twice daily for 7 to 10 days She was given a series of 4 hip abduction and rotation exercises  She should see some pain relief in 10 days and improve strength in 4 weeks If she is still feeling the symptoms or weakness in 4 weeks I would like to recheck her

## 2021-02-08 ENCOUNTER — Ambulatory Visit: Payer: 59

## 2021-02-09 ENCOUNTER — Ambulatory Visit
Admission: RE | Admit: 2021-02-09 | Discharge: 2021-02-09 | Disposition: A | Discharge status: Home / Self Care | Payer: BC Managed Care – PPO | Source: Ambulatory Visit | Attending: Internal Medicine | Admitting: Internal Medicine

## 2021-02-09 ENCOUNTER — Other Ambulatory Visit: Payer: Self-pay

## 2021-02-09 DIAGNOSIS — Z1231 Encounter for screening mammogram for malignant neoplasm of breast: Secondary | ICD-10-CM

## 2021-03-23 ENCOUNTER — Other Ambulatory Visit: Payer: Self-pay

## 2021-03-23 ENCOUNTER — Ambulatory Visit (INDEPENDENT_AMBULATORY_CARE_PROVIDER_SITE_OTHER): Payer: BC Managed Care – PPO | Admitting: Allergy and Immunology

## 2021-03-23 ENCOUNTER — Encounter: Payer: Self-pay | Admitting: Allergy and Immunology

## 2021-03-23 ENCOUNTER — Other Ambulatory Visit: Payer: Self-pay | Admitting: Allergy and Immunology

## 2021-03-23 VITALS — BP 122/62 | HR 64 | Temp 98.1°F | Resp 16 | Ht 64.57 in | Wt 150.0 lb

## 2021-03-23 DIAGNOSIS — T7800XD Anaphylactic reaction due to unspecified food, subsequent encounter: Secondary | ICD-10-CM

## 2021-03-23 DIAGNOSIS — L989 Disorder of the skin and subcutaneous tissue, unspecified: Secondary | ICD-10-CM

## 2021-03-23 DIAGNOSIS — T7800XA Anaphylactic reaction due to unspecified food, initial encounter: Secondary | ICD-10-CM

## 2021-03-23 MED ORDER — PIMECROLIMUS 1 % EX CREA: TOPICAL_CREAM | CUTANEOUS | 5 refills | Status: DC

## 2021-03-23 MED ORDER — CETIRIZINE HCL 10 MG PO TABS: 10.0000 mg | ORAL_TABLET | Freq: Every day | ORAL | 1 refills | Status: DC

## 2021-03-23 MED ORDER — AUVI-Q 0.3 MG/0.3ML IJ SOAJ: INTRAMUSCULAR | 3 refills | Status: DC

## 2021-03-23 MED ORDER — OPZELURA 1.5 % EX CREA: 2.0000 "application " | TOPICAL_CREAM | Freq: Two times a day (BID) | CUTANEOUS | 1 refills | Status: DC

## 2021-03-23 MED ORDER — BETAMETHASONE DIPROPIONATE 0.05 % EX OINT: 1.0000 "application " | TOPICAL_OINTMENT | Freq: Every day | CUTANEOUS | 1 refills | Status: DC

## 2021-03-23 NOTE — Progress Notes (Signed)
[place] - [place] - [place]   Follow-up Note  Referring Provider: Plotnikov, Evie Lacks, MD Primary Provider: Cassandria Anger, MD Date of Office Visit: 03/23/2021  Subjective:   [name] (DOB: 1955-06-09) is a 65 y.o. female who returns to the Allergy and [place] on 03/23/2021 in re-evaluation of the following:  HPI: Marcela returns to this clinic in evaluation of atopic dermatitis and history of food allergy directed against dairy and fish.  Her last visit to this clinic was 15 September 2020.  Overall she is doing much better and is very pleased with the response that she has received.  She had a rather significant improvement with the use of dupilumab injections and she discontinued this biologic agent in the spring 2022 and continued to do very well.  She has 3 different types of dermatosis.  The dermatosis that has responded to dupilumab and has for the most part not returned except for a slight flare that occurred during her by bivalent COVID vaccination recently, does not appear to be a significant issue at this point in time.  It does appear as though dupilumab changed her skin immunology and this inflammatory dermatosis is no longer an issue.  She also has a issue involving her elbows that to some degree resembles psoriasis and initially responded to a Jak 2 inhibitor topical agent with elimination of swelling and redness but sometime in August she had a flare that did not respond to this agent and she went back to using her calcineurin inhibitor and medium potency topical steroid which worked well and now that issue is somewhat quiescent.  She ha also had a transient dermatitis involving her posterior neck that was intensely itchy and red that came up in August 2022 and required administration of betamethasone for a month administered about every third day but fortunately that issue appears to have resolved.  She remains away from consumption of  blue cheese or any cheese that has mold, "red" fish such as salmon and Trout.  She can eat white fish and tuna with no problem.  She can consume other forms of dairy with no problem.  Allergies as of 03/23/2021       Reactions   Penicillins Anaphylaxis   REACTION: Anyphylactic shock        Medication List    Auvi-Q 0.3 mg/0.3 mL Soaj injection Generic drug: EPINEPHrine Use as directed for life-threatening allergic reaction.   betamethasone dipropionate 0.05 % ointment Commonly known as: DIPROLENE Apply 1 application topically 2 (two) times daily. As needed   Cholecalciferol 25 MCG (1000 UT) tablet Commonly known as: EQL Vitamin D3 Take 2 tablets (2,000 Units total) by mouth daily. 2 tabs po daily   ibuprofen 800 MG tablet Commonly known as: ADVIL Take 1 tab twice daily with food for 7-10 days. Then take as needed.   Opzelura 1.5 % Crea Generic drug: Ruxolitinib Phosphate Apply 2 application topically in the morning and at bedtime. apply two times per day to posterior elbow   pimecrolimus 1 % cream Commonly known as: Elidel Apply to skin one to two times daily as directed.   zolpidem 5 MG tablet Commonly known as: AMBIEN Take 1 tablet (5 mg total) by mouth at bedtime as needed. for sleep    Past Medical History:  Diagnosis Date   Allergy    GI upset from allergies to fish etc   Anemia    long ago- past hx  Cataract    mild   GERD (gastroesophageal reflux disease)    Hereditary angioedema (HCC)    high IgE levels   Hives    history of- cold exposure related   Hx of small bowel obstruction    due to hereditary andioedema- triggered by salmon    Osteopenia 07/2017   T score -1.9 FRAX 9% / 1.1%    Past Surgical History:  Procedure Laterality Date   COLONOSCOPY  2009   DB    EXPLORATORY LAPAROTOMY     UPPER GASTROINTESTINAL ENDOSCOPY      Review of systems negative except as noted in HPI / PMHx or noted below:  Review of Systems  Constitutional:  Negative.   HENT: Negative.    Eyes: Negative.   Respiratory: Negative.    Cardiovascular: Negative.   Gastrointestinal: Negative.   Genitourinary: Negative.   Musculoskeletal: Negative.   Skin: Negative.   Neurological: Negative.   Endo/Heme/Allergies: Negative.   Psychiatric/Behavioral: Negative.      Objective:   Vitals:   03/23/21 1631  BP: 122/62  Pulse: 64  Resp: 16  Temp: 98.1 F (36.7 C)  SpO2: 100%   Height: 5' 4.57" (164 cm)  Weight: 150 lb (68 kg)   Physical Exam Skin:    Findings: Rash (Slightly indurated erythematous posterior elbows approximately 1 to 2 cm diameter bilaterally) present.    Diagnostics: none  Assessment and Plan:   1. Inflammatory dermatosis   2. Allergy with anaphylaxis due to food     1.  Treat skin inflammation with the following during flare-ups:   A. Elidil followed by mometasone 0.1% ointment 1-2 times per day  B. Opzelura - 1-2 times per day  C. Betamethasone 0.05% ointment - 1-2 times per day  2. Can continue Zyrtec if needed  3. Auvi-Q 0.3, benadryl, MD/ER evaluation for allergic reaction  4. Obtain fall flu vaccine  5. Return to clinic in 12 months or earlier if problem  Ovie appears to be doing very well on her current therapy and she appears to have had some disease modification regarding her inflammatory dermatosis while using dupilumab and that has been a persistent improvement even after discontinuing this biologic agent.  She now has the option of using 3 different types of anti-inflammatory therapies for her skin should she develop a flareup.  She can use Elidel and mometasone in combination, a topical Jak 2 inhibitor, or high potency topical steroid, depending on the extent of her flareup.  She appears to have a very good understanding of these medications and how to use these medications appropriately and the appropriate dosing depending on disease activity.  I will see her back in this clinic in 1 year or earlier  if there is a problem.  Allena Katz, MD Allergy / Immunology [place]

## 2021-03-23 NOTE — Patient Instructions (Addendum)
  1.  Treat skin inflammation with the following during flare-ups:   A. Elidil followed by mometasone 0.1% ointment 1-2 times per day  B. Opzelura - 1-2 times per day  C. Betamethasone 0.05% ointment - 1-2 times per day  2. Can continue Zyrtec if needed  3. Auvi-Q 0.3, benadryl, MD/ER evaluation for allergic reaction  4. Obtain fall flu vaccine  5. Return to clinic in 12 months or earlier if problem

## 2021-03-24 ENCOUNTER — Encounter: Payer: Self-pay | Admitting: Allergy and Immunology

## 2021-04-01 ENCOUNTER — Ambulatory Visit (INDEPENDENT_AMBULATORY_CARE_PROVIDER_SITE_OTHER): Payer: BC Managed Care – PPO | Admitting: Sports Medicine

## 2021-04-01 VITALS — BP 126/84 | Ht 64.57 in | Wt 145.0 lb

## 2021-04-01 DIAGNOSIS — M25559 Pain in unspecified hip: Secondary | ICD-10-CM | POA: Diagnosis not present

## 2021-04-01 MED ORDER — PREDNISONE 10 MG PO TABS: ORAL_TABLET | ORAL | 0 refills | Status: DC

## 2021-04-01 NOTE — Progress Notes (Signed)
   Subjective:    Patient ID: [name], female    DOB: Nov 26, 1955, 65 y.o.   MRN: [id number]  HPI chief complaint: Right hip pain  [name] presents today complaining of sudden onset lateral and anterior right hip pain that began after a kettle bell workout on October 19.  She does not recall any specific injury at the time of the workout but did experience sudden pain shortly thereafter.  This is not a new exercise for her but she did increase her resistance from 30 pounds to 50 pounds.  She localizes the pain just inferior to the iliac crest on the right.  Pain does radiate into the anterior hip and into the groin.  Her pain did resolve but she reaggravated it this past weekend doing yoga.  She had a strain of the gluteus medius tendon in the same hip back in July but her current pain is different in nature than what she experienced with that injury.  She has been taking ibuprofen but it has not been helpful.  Her pain is most noticeable at night as well as at the end of the day.  She is taking Unisom to help her sleep which has been helpful.  She denies numbness or tingling.  Pain does not radiate down past the knee.  Intra medical history reviewed Medications reviewed Allergies reviewed    Review of Systems As above    Objective:   Physical Exam  Well-developed, well-nourished.  No acute distress  Right hip: Smooth painless hip range of motion with a negative logroll.  She is tender to palpation just underneath the iliac crest in the anterior lateral hip.  She has no pain with resisted hip flexion but does have pain with active hip abduction.  Neurovascularly intact distally.      Assessment & Plan:   Right hip pain likely secondary to tensor fascia lata strain  6-day Sterapred Dosepak to take as directed.  She is instructed not to take any over-the-counter NSAIDs while on steroids.  She may continue with Unisom to help sleep at night but this is not something that I want her to  take chronically.  She understands this.  If symptoms persist then consider x-ray and formal physical therapy.  Follow-up for ongoing or recalcitrant issues.  This note was dictated using Dragon naturally speaking software and may contain errors in syntax, spelling, or content which have not been identified prior to signing this note.

## 2021-04-15 ENCOUNTER — Other Ambulatory Visit: Payer: Self-pay

## 2021-04-15 ENCOUNTER — Telehealth (INDEPENDENT_AMBULATORY_CARE_PROVIDER_SITE_OTHER): Payer: BC Managed Care – PPO | Admitting: Nurse Practitioner

## 2021-04-15 ENCOUNTER — Encounter: Payer: Self-pay | Admitting: Nurse Practitioner

## 2021-04-15 VITALS — Temp 98.0°F

## 2021-04-15 DIAGNOSIS — H938X2 Other specified disorders of left ear: Secondary | ICD-10-CM | POA: Insufficient documentation

## 2021-04-15 DIAGNOSIS — R0981 Nasal congestion: Secondary | ICD-10-CM | POA: Insufficient documentation

## 2021-04-15 MED ORDER — LEVOCETIRIZINE DIHYDROCHLORIDE 5 MG PO TABS
5.0000 mg | ORAL_TABLET | Freq: Every evening | ORAL | 0 refills | Status: AC
Start: 2021-04-15 — End: ?

## 2021-04-15 MED ORDER — FLUTICASONE PROPIONATE 50 MCG/ACT NA SUSP: 2.0000 | Freq: Every day | NASAL | 0 refills | Status: AC

## 2021-04-15 NOTE — Assessment & Plan Note (Signed)
We will send an antihistamine and fluticasone with some upper airway symptoms.  Continue to monitor if she is not improving she will reach out for an office visit.

## 2021-04-15 NOTE — Progress Notes (Signed)
Patient ID: [name], female    DOB: 09/16/1955, 65 y.o.   MRN: [id number]  Virtual visit completed through [place], a video enabled telemedicine application. Due to national recommendations of social distancing due to COVID-19, a virtual visit is felt to be most appropriate for this patient at this time. Reviewed limitations, risks, security and privacy concerns of performing a virtual visit and the availability of in person appointments. I also reviewed that there may be a patient responsible charge related to this service. The patient agreed to proceed.   Patient location: home Provider location: [place] [facility], office Persons participating in this virtual visit: patient, provider   If any vitals were documented, they were collected by patient at home unless specified below.    Temp 98 F (36.7 C) Comment: per patient   CC: Nasal Congestion Subjective:   HPI: [name] is a 65 y.o. female presenting on 04/15/2021 for Nasal Congestion (Sx started on 04/10/21, tongue feels inflamed, post nasal drip, left ear canal swollen-decreased hearing, earache, nasal congestion, some sore throat. No fever or body aches. Covid test negative on 04/10/21 and 04/12/21. [facility]. )   Symptoms 04/10/2021 Two at home that were negative and pending [facility] that was done yesterday 04/14/2021 Vaccinated x2 with 2 booster Went to conference returned 04/07/2021. Wore her mask while around other people. Unsure of true sick contact  She is taking dyquill and nyquill and it is helping  Symptoms ae staying the same Recently stopped prednisone(completed course)   Relevant past medical, surgical, family and social history reviewed and updated as indicated. Interim medical history since our last visit reviewed. Allergies and medications reviewed and updated. Outpatient Medications Prior to Visit  Medication Sig Dispense Refill   AUVI-Q 0.3  MG/0.3ML SOAJ injection Use as directed for life-threatening allergic reaction. 2 each 3   betamethasone dipropionate (DIPROLENE) 0.05 % ointment Apply 1 application topically daily. Apply 1-2 times per day 30 g 1   cetirizine (ZYRTEC) 10 MG tablet Take 1 tablet (10 mg total) by mouth daily. 30 tablet 1   Cholecalciferol (EQL VITAMIN D3) 1000 units tablet Take 2 tablets (2,000 Units total) by mouth daily. 2 tabs po daily 100 tablet 3   ibuprofen (ADVIL) 800 MG tablet Take 1 tab twice daily with food for 7-10 days. Then take as needed. 40 tablet 0   pimecrolimus (ELIDEL) 1 % cream APPLY TO SKIN ONE TO TWO TIMES DAILY AS DIRECTED. 30 g 5   Ruxolitinib Phosphate (OPZELURA) 1.5 % CREA Apply 2 application topically in the morning and at bedtime. apply two times per day to posterior elbow 60 g 1   zolpidem (AMBIEN) 5 MG tablet Take 1 tablet (5 mg total) by mouth at bedtime as needed. for sleep 30 tablet 2   predniSONE (DELTASONE) 10 MG tablet Use as directed per doctors orders. 21 tablet 0   No facility-administered medications prior to visit.     Per HPI unless specifically indicated in ROS section below Review of Systems  Constitutional:  Negative for chills and fever.  HENT:  Positive for ear pain (pressure) and sore throat (resolved). Negative for sinus pressure and sinus pain.   Respiratory:  Positive for cough. Negative for shortness of breath.   Cardiovascular:  Negative for chest pain.  Gastrointestinal:  Negative for diarrhea, nausea and vomiting.  Musculoskeletal:  Negative for arthralgias and myalgias.  Neurological:  Negative for dizziness,  light-headedness and headaches.  Objective:  Temp 98 F (36.7 C) Comment: per patient  Wt Readings from Last 3 Encounters:  04/01/21 145 lb (65.8 kg)  03/23/21 150 lb (68 kg)  12/24/20 142 lb (64.4 kg)       Physical exam: Gen: alert, NAD, not ill appearing Pulm: speaks in complete sentences without increased work of breathing Psych:  normal mood, normal thought content      Results for orders placed or performed in visit on 04/15/20  Allergen food profile specific IgE  Result Value Ref Range   Class Description Allergens Comment    IgE (Immunoglobulin E), Serum 2,911 (H) 6 - 495 IU/mL   Egg White IgE 0.14 (A) Class 0/I kU/L   Milk IgE 0.92 (A) Class II kU/L   Codfish IgE <0.10 Class 0 kU/L   Wheat IgE <0.10 Class 0 kU/L   Allergen Corn, IgE 0.34 (A) Class I kU/L   Peanut IgE 0.11 (A) Class 0/I kU/L   Soybean IgE 0.16 (A) Class 0/I kU/L   Shrimp IgE 0.18 (A) Class 0/I kU/L   Allergen Tomato, IgE 0.21 (A) Class 0/I kU/L   Orange <0.10 Class 0 kU/L   Tuna <0.10 Class 0 kU/L   Allergen Apple, IgE <0.10 Class 0 kU/L   Chicken IgE <0.10 Class 0 kU/L  Specimen status report  Result Value Ref Range   specimen status report Comment    Assessment & Plan:   Problem List Items Addressed This Visit       Nervous and Auditory   Sensation of fullness in left ear - Primary    Patient has been using Zyrtec tach without great relief.  States DayQuil does seem to help with a decongestant in it.  Did discuss with patient we will try different antihistamine levocetirizine and Flonase to see if this will have some benefit.  She also mentioned Afrin did say that is a medication that can help gave strict precautions of no more than 3 days use patient acknowledged.  We will continue to monitor if she does not improve we will need to see her in office I did discuss this with her.  Also discussed neck step therapy of prednisone but she stated she just Dosepak.      Relevant Medications   fluticasone (FLONASE) 50 MCG/ACT nasal spray   levocetirizine (XYZAL) 5 MG tablet     Other   Nasal congestion    We will send an antihistamine and fluticasone with some upper airway symptoms.  Continue to monitor if she is not improving she will reach out for an office visit.      Relevant Medications   fluticasone (FLONASE) 50 MCG/ACT nasal  spray     No orders of the defined types were placed in this encounter.  No orders of the defined types were placed in this encounter.   I discussed the assessment and treatment plan with the patient. The patient was provided an opportunity to ask questions and all were answered. The patient agreed with the plan and demonstrated an understanding of the instructions. The patient was advised to call back or seek an in-person evaluation if the symptoms worsen or if the condition fails to improve as anticipated.  Follow up plan: No follow-ups on file.  Romilda Garret, NP

## 2021-04-15 NOTE — Assessment & Plan Note (Signed)
Patient has been using Zyrtec tach without great relief.  States DayQuil does seem to help with a decongestant in it.  Did discuss with patient we will try different antihistamine levocetirizine and Flonase to see if this will have some benefit.  She also mentioned Afrin did say that is a medication that can help gave strict precautions of no more than 3 days use patient acknowledged.  We will continue to monitor if she does not improve we will need to see her in office I did discuss this with her.  Also discussed neck step therapy of prednisone but she stated she just Dosepak.

## 2021-07-09 ENCOUNTER — Telehealth: Payer: Self-pay | Admitting: Allergy and Immunology

## 2021-07-09 ENCOUNTER — Other Ambulatory Visit: Payer: Self-pay | Admitting: Allergy and Immunology

## 2021-07-09 ENCOUNTER — Other Ambulatory Visit: Payer: Self-pay | Admitting: *Deleted

## 2021-07-09 DIAGNOSIS — L989 Disorder of the skin and subcutaneous tissue, unspecified: Secondary | ICD-10-CM

## 2021-07-09 MED ORDER — OPZELURA 1.5 % EX CREA: 2.0000 "application " | TOPICAL_CREAM | Freq: Two times a day (BID) | CUTANEOUS | 8 refills | Status: DC

## 2021-07-09 NOTE — Telephone Encounter (Signed)
[name] called in and states she never received a prescription refill for [name] and would [facility] [place]

## 2021-07-09 NOTE — Telephone Encounter (Signed)
RX sent

## 2021-07-13 DIAGNOSIS — D2372 Other benign neoplasm of skin of left lower limb, including hip: Secondary | ICD-10-CM | POA: Diagnosis not present

## 2021-07-20 ENCOUNTER — Ambulatory Visit (INDEPENDENT_AMBULATORY_CARE_PROVIDER_SITE_OTHER): Payer: BC Managed Care – PPO | Admitting: Internal Medicine

## 2021-07-20 ENCOUNTER — Other Ambulatory Visit: Payer: Self-pay

## 2021-07-20 ENCOUNTER — Encounter: Payer: Self-pay | Admitting: Internal Medicine

## 2021-07-20 VITALS — BP 118/72 | HR 61 | Temp 97.6°F | Resp 18 | Ht 64.57 in | Wt 148.2 lb

## 2021-07-20 DIAGNOSIS — E538 Deficiency of other specified B group vitamins: Secondary | ICD-10-CM

## 2021-07-20 DIAGNOSIS — Z0000 Encounter for general adult medical examination without abnormal findings: Secondary | ICD-10-CM

## 2021-07-20 DIAGNOSIS — Z86010 Personal history of colonic polyps: Secondary | ICD-10-CM

## 2021-07-20 DIAGNOSIS — E559 Vitamin D deficiency, unspecified: Secondary | ICD-10-CM

## 2021-07-20 NOTE — Patient Instructions (Signed)

## 2021-07-20 NOTE — Assessment & Plan Note (Signed)
She is using sublingual vitamin B12

## 2021-07-20 NOTE — Assessment & Plan Note (Signed)
Colon due in 2024 

## 2021-07-20 NOTE — Assessment & Plan Note (Addendum)
We discussed age appropriate health related issues, including available/recomended screening tests and vaccinations. We discussed a need for adhering to healthy diet and exercise. Labs/EKG were reviewed/ordered. All questions were answered. She has a GYN here Jonelle Sidle, NP. Ophth - Dr Satira Sark. Colon up to date. Colon - 2019, next due 2024 (polyp) Shingrix suggested A cardiac CT scan for coronary calcium offered

## 2021-07-20 NOTE — Progress Notes (Signed)
Subjective:  Patient ID: [name], female    DOB: 31-Oct-1955  Age: 66 y.o. MRN: [id number]  CC: Annual Exam (No concerns. )   HPI [name] presents for a well exam  Outpatient Medications Prior to Visit  Medication Sig Dispense Refill   AUVI-Q 0.3 MG/0.3ML SOAJ injection Use as directed for life-threatening allergic reaction. 2 each 3   betamethasone dipropionate (DIPROLENE) 0.05 % ointment Apply 1 application topically daily. Apply 1-2 times per day 30 g 1   cetirizine (ZYRTEC) 10 MG tablet Take 1 tablet (10 mg total) by mouth daily. 30 tablet 1   Cholecalciferol (EQL VITAMIN D3) 1000 units tablet Take 2 tablets (2,000 Units total) by mouth daily. 2 tabs po daily 100 tablet 3   fluticasone (FLONASE) 50 MCG/ACT nasal spray Place 2 sprays into both nostrils daily. 16 g 0   ibuprofen (ADVIL) 800 MG tablet Take 1 tab twice daily with food for 7-10 days. Then take as needed. 40 tablet 0   levocetirizine (XYZAL) 5 MG tablet Take 1 tablet (5 mg total) by mouth every evening. 15 tablet 0   pimecrolimus (ELIDEL) 1 % cream APPLY TO SKIN ONE TO TWO TIMES DAILY AS DIRECTED. 30 g 5   Ruxolitinib Phosphate (OPZELURA) 1.5 % CREA Apply 2 application topically in the morning and at bedtime. apply two times per day to posterior elbow 60 g 8   zolpidem (AMBIEN) 5 MG tablet Take 1 tablet (5 mg total) by mouth at bedtime as needed. for sleep 30 tablet 2   No facility-administered medications prior to visit.    ROS: Review of Systems  Constitutional:  Negative for activity change, appetite change, chills, fatigue and unexpected weight change.  HENT:  Negative for congestion, mouth sores and sinus pressure.   Eyes:  Negative for visual disturbance.  Respiratory:  Negative for cough and chest tightness.   Gastrointestinal:  Negative for abdominal pain and nausea.  Genitourinary:  Negative for difficulty urinating, frequency and vaginal pain.  Musculoskeletal:  Negative for back pain and gait  problem.  Skin:  Negative for pallor and rash.  Neurological:  Negative for dizziness, tremors, weakness, numbness and headaches.  Psychiatric/Behavioral:  Negative for confusion and sleep disturbance.    Objective:  BP 118/72    Pulse 61    Temp 97.6 F (36.4 C) (Oral)    Resp 18    Ht 5' 4.57" (1.64 m)    Wt 148 lb 3.2 oz (67.2 kg)    SpO2 98%    BMI 24.99 kg/m   BP Readings from Last 3 Encounters:  07/20/21 118/72  04/01/21 126/84  03/23/21 122/62    Wt Readings from Last 3 Encounters:  07/20/21 148 lb 3.2 oz (67.2 kg)  04/01/21 145 lb (65.8 kg)  03/23/21 150 lb (68 kg)    Physical Exam Constitutional:      General: She is not in acute distress.    Appearance: She is well-developed.  HENT:     Head: Normocephalic.     Right Ear: External ear normal.     Left Ear: External ear normal.     Nose: Nose normal.  Eyes:     General:        Right eye: No discharge.        Left eye: No discharge.     Conjunctiva/sclera: Conjunctivae normal.     Pupils: Pupils are equal, round, and reactive to light.  Neck:     Thyroid: No thyromegaly.  Vascular: No JVD.     Trachea: No tracheal deviation.  Cardiovascular:     Rate and Rhythm: Normal rate and regular rhythm.     Heart sounds: Normal heart sounds.  Pulmonary:     Effort: No respiratory distress.     Breath sounds: No stridor. No wheezing.  Abdominal:     General: Bowel sounds are normal. There is no distension.     Palpations: Abdomen is soft. There is no mass.     Tenderness: There is no abdominal tenderness. There is no guarding or rebound.  Musculoskeletal:        General: No tenderness.     Cervical back: Normal range of motion and neck supple. No rigidity.  Lymphadenopathy:     Cervical: No cervical adenopathy.  Skin:    Findings: No erythema or rash.  Neurological:     Mental Status: She is oriented to person, place, and time.     Cranial Nerves: No cranial nerve deficit.     Motor: No abnormal muscle  tone.     Coordination: Coordination normal.     Deep Tendon Reflexes: Reflexes normal.  Psychiatric:        Behavior: Behavior normal.        Thought Content: Thought content normal.        Judgment: Judgment normal.    Lab Results  Component Value Date   WBC 5.6 09/09/2019   HGB 14.0 09/09/2019   HCT 40.3 09/09/2019   PLT 227.0 09/09/2019   GLUCOSE 96 09/09/2019   CHOL 207 (H) 10/13/2017   TRIG 51.0 10/13/2017   HDL 67.00 10/13/2017   LDLCALC 129 (H) 10/13/2017   ALT 20 09/09/2019   AST 19 09/09/2019   NA 138 09/09/2019   K 4.7 09/09/2019   CL 103 09/09/2019   CREATININE 0.74 09/09/2019   BUN 15 09/09/2019   CO2 28 09/09/2019   TSH 2.35 09/09/2019    MM 3D SCREEN BREAST BILATERAL  Result Date: 02/13/2021 CLINICAL DATA:  Screening. EXAM: DIGITAL SCREENING BILATERAL MAMMOGRAM WITH TOMOSYNTHESIS AND CAD TECHNIQUE: Bilateral screening digital craniocaudal and mediolateral oblique mammograms were obtained. Bilateral screening digital breast tomosynthesis was performed. The images were evaluated with computer-aided detection. COMPARISON:  Previous exam(s). ACR Breast Density Category b: There are scattered areas of fibroglandular density. FINDINGS: There are no findings suspicious for malignancy. IMPRESSION: No mammographic evidence of malignancy. A result letter of this screening mammogram will be mailed directly to the patient. RECOMMENDATION: Screening mammogram in one year. (Code:SM-B-01Y) BI-RADS CATEGORY  1: Negative. Electronically Signed   By: Lovey Newcomer M.D.   On: 02/13/2021 09:42    Assessment & Plan:   Problem List Items Addressed This Visit     B12 deficiency    She is using sublingual vitamin B12      Relevant Orders   Vitamin B12   History of colon polyps    Colon due in 2024      Vitamin D deficiency    Risks associated with treatment noncompliance were discussed. Compliance was encouraged.      Relevant Orders   VITAMIN D 25 Hydroxy (Vit-D  Deficiency, Fractures)   Well adult exam - Primary    We discussed age appropriate health related issues, including available/recomended screening tests and vaccinations. We discussed a need for adhering to healthy diet and exercise. Labs/EKG were reviewed/ordered. All questions were answered. She has a GYN here Jonelle Sidle, NP. Ophth - Dr Satira Sark. Colon up to date. Colon -  2019, next due 2024 (polyp) Shingrix suggested A cardiac CT scan for coronary calcium offered      Relevant Orders   TSH   Urinalysis   CBC with Differential/Platelet   Lipid panel   Comprehensive metabolic panel   Vitamin P10   VITAMIN D 25 Hydroxy (Vit-D Deficiency, Fractures)      No orders of the defined types were placed in this encounter.     Follow-up: Return in about 1 year (around 07/20/2022) for Wellness Exam.  Walker Kehr, MD

## 2021-07-20 NOTE — Assessment & Plan Note (Signed)
Risks associated with treatment noncompliance were discussed. Compliance was encouraged. 

## 2021-08-03 DIAGNOSIS — H43811 Vitreous degeneration, right eye: Secondary | ICD-10-CM | POA: Diagnosis not present

## 2021-08-03 DIAGNOSIS — H04123 Dry eye syndrome of bilateral lacrimal glands: Secondary | ICD-10-CM | POA: Diagnosis not present

## 2021-08-03 DIAGNOSIS — H5213 Myopia, bilateral: Secondary | ICD-10-CM | POA: Diagnosis not present

## 2021-08-03 DIAGNOSIS — H2513 Age-related nuclear cataract, bilateral: Secondary | ICD-10-CM | POA: Diagnosis not present

## 2021-08-05 DIAGNOSIS — M25512 Pain in left shoulder: Secondary | ICD-10-CM | POA: Diagnosis not present

## 2021-08-20 DIAGNOSIS — M25512 Pain in left shoulder: Secondary | ICD-10-CM | POA: Diagnosis not present

## 2021-11-03 ENCOUNTER — Ambulatory Visit: Payer: BC Managed Care – PPO | Admitting: Sports Medicine

## 2021-11-11 ENCOUNTER — Other Ambulatory Visit (INDEPENDENT_AMBULATORY_CARE_PROVIDER_SITE_OTHER): Payer: BC Managed Care – PPO

## 2021-11-11 DIAGNOSIS — Z0000 Encounter for general adult medical examination without abnormal findings: Secondary | ICD-10-CM

## 2021-11-11 DIAGNOSIS — E538 Deficiency of other specified B group vitamins: Secondary | ICD-10-CM | POA: Diagnosis not present

## 2021-11-11 DIAGNOSIS — E559 Vitamin D deficiency, unspecified: Secondary | ICD-10-CM | POA: Diagnosis not present

## 2021-11-11 LAB — TSH: TSH: 3.23 u[IU]/mL (ref 0.35–5.50)

## 2021-11-11 LAB — CBC WITH DIFFERENTIAL/PLATELET
Basophils Absolute: 0 10*3/uL (ref 0.0–0.1)
Basophils Relative: 0.6 % (ref 0.0–3.0)
Eosinophils Absolute: 0.2 10*3/uL (ref 0.0–0.7)
Eosinophils Relative: 3.7 % (ref 0.0–5.0)
HCT: 40.4 % (ref 36.0–46.0)
Hemoglobin: 13.5 g/dL (ref 12.0–15.0)
Lymphocytes Relative: 37 % (ref 12.0–46.0)
Lymphs Abs: 2 10*3/uL (ref 0.7–4.0)
MCHC: 33.4 g/dL (ref 30.0–36.0)
MCV: 92.6 fl (ref 78.0–100.0)
Monocytes Absolute: 0.3 10*3/uL (ref 0.1–1.0)
Monocytes Relative: 6.2 % (ref 3.0–12.0)
Neutro Abs: 2.9 10*3/uL (ref 1.4–7.7)
Neutrophils Relative %: 52.5 % (ref 43.0–77.0)
Platelets: 172 10*3/uL (ref 150.0–400.0)
RBC: 4.36 Mil/uL (ref 3.87–5.11)
RDW: 12.9 % (ref 11.5–15.5)
WBC: 5.5 10*3/uL (ref 4.0–10.5)

## 2021-11-11 LAB — URINALYSIS
Bilirubin Urine: NEGATIVE
Hgb urine dipstick: NEGATIVE
Ketones, ur: NEGATIVE
Leukocytes,Ua: NEGATIVE
Nitrite: NEGATIVE
Specific Gravity, Urine: 1.01 (ref 1.000–1.030)
Total Protein, Urine: NEGATIVE
Urine Glucose: NEGATIVE
Urobilinogen, UA: 0.2 (ref 0.0–1.0)
pH: 7 (ref 5.0–8.0)

## 2021-11-11 LAB — COMPREHENSIVE METABOLIC PANEL
ALT: 19 U/L (ref 0–35)
AST: 17 U/L (ref 0–37)
Albumin: 4.1 g/dL (ref 3.5–5.2)
Alkaline Phosphatase: 59 U/L (ref 39–117)
BUN: 14 mg/dL (ref 6–23)
CO2: 29 mEq/L (ref 19–32)
Calcium: 9.2 mg/dL (ref 8.4–10.5)
Chloride: 103 mEq/L (ref 96–112)
Creatinine, Ser: 0.84 mg/dL (ref 0.40–1.20)
GFR: 73.52 mL/min (ref >60.00)
Glucose, Bld: 94 mg/dL (ref 70–99)
Potassium: 4.3 mEq/L (ref 3.5–5.1)
Sodium: 138 mEq/L (ref 135–145)
Total Bilirubin: 0.7 mg/dL (ref 0.2–1.2)
Total Protein: 6.8 g/dL (ref 6.0–8.3)

## 2021-11-11 LAB — LIPID PANEL
Cholesterol: 204 mg/dL — ABNORMAL HIGH (ref 0–200)
HDL: 63.1 mg/dL (ref >39.00)
LDL Cholesterol: 129 mg/dL — ABNORMAL HIGH (ref 0–99)
NonHDL: 141.25
Total CHOL/HDL Ratio: 3
Triglycerides: 59 mg/dL (ref 0.0–149.0)
VLDL: 11.8 mg/dL (ref 0.0–40.0)

## 2021-11-11 LAB — VITAMIN D 25 HYDROXY (VIT D DEFICIENCY, FRACTURES): VITD: 33.16 ng/mL (ref 30.00–100.00)

## 2021-11-11 LAB — VITAMIN B12: Vitamin B-12: 355 pg/mL (ref 211–911)

## 2021-11-22 ENCOUNTER — Ambulatory Visit: Payer: Self-pay | Admitting: Nurse Practitioner

## 2021-11-27 IMAGING — MG DIGITAL SCREENING BILAT W/ TOMO W/ CAD
8 series · 8 of 24 positions shown · non-contrast
Comparison: Previous exam(s).

CLINICAL DATA: Screening.

EXAM:
DIGITAL SCREENING BILATERAL MAMMOGRAM WITH TOMO AND CAD

[R CC synth-2D]
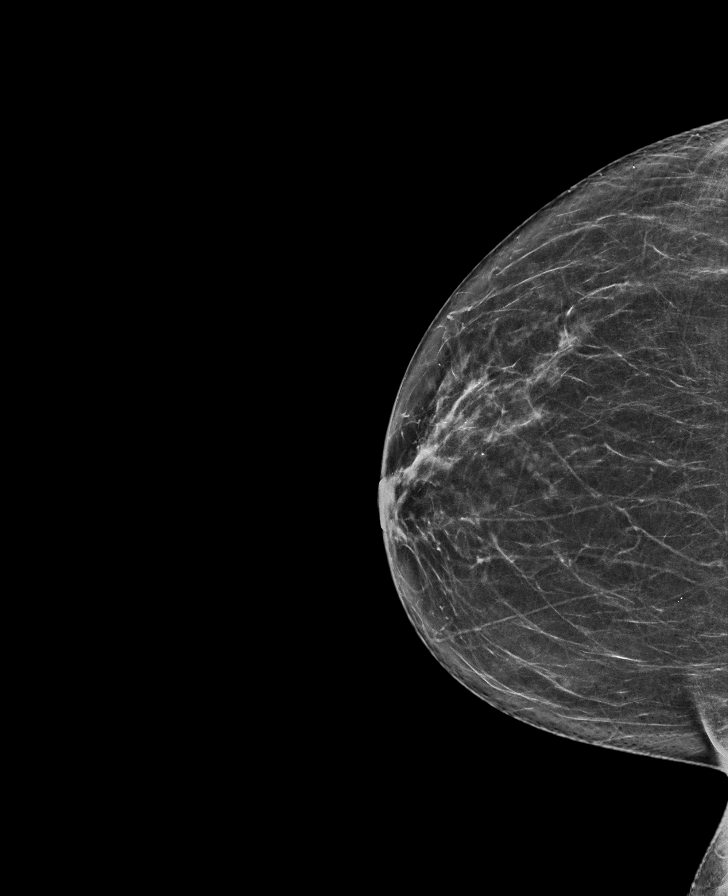

[L CC synth-2D]
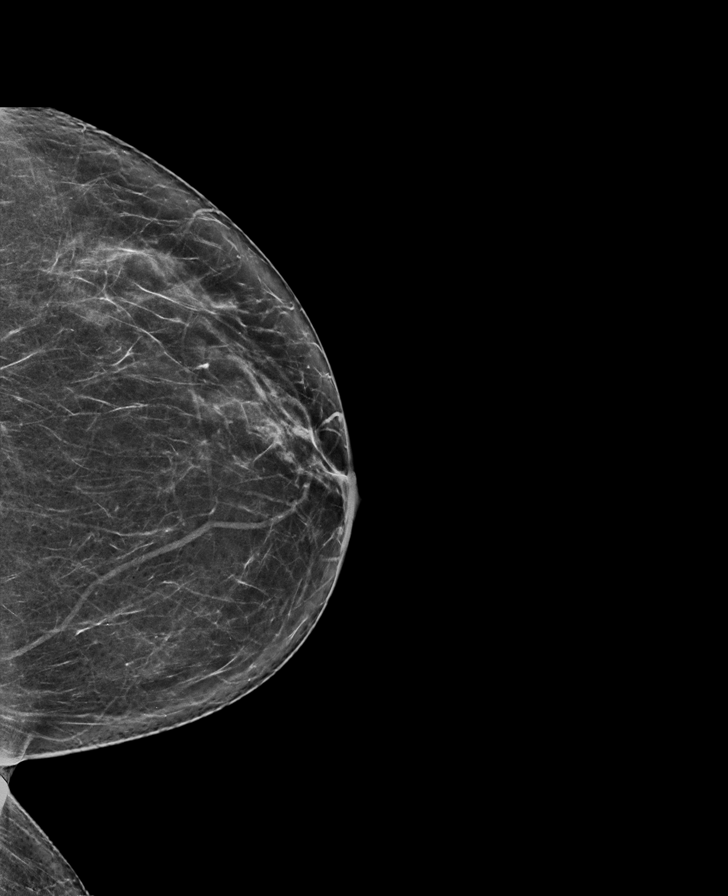

[L MLO synth-2D]
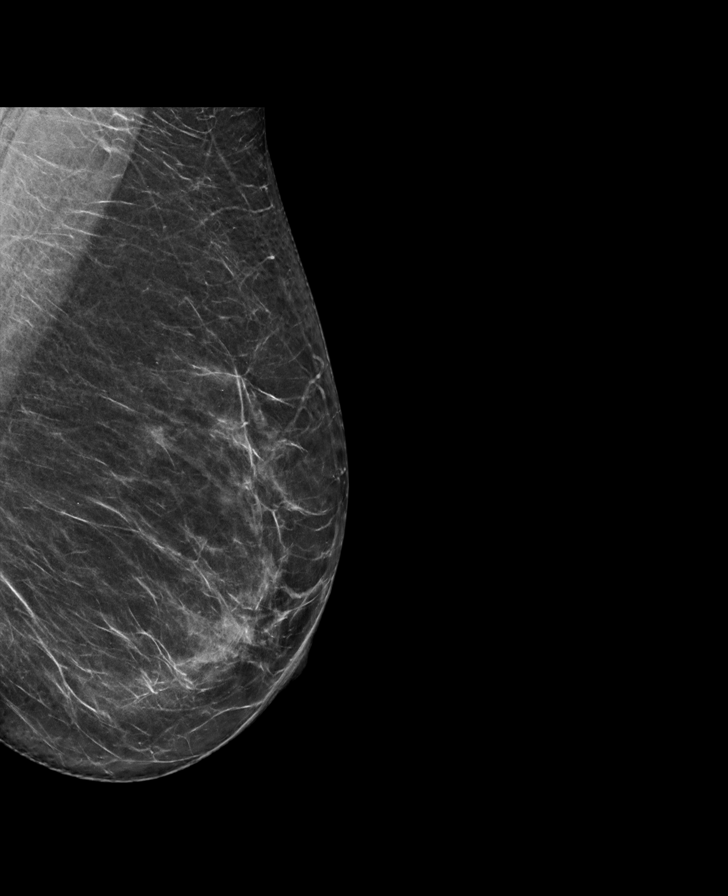

[R MLO synth-2D]
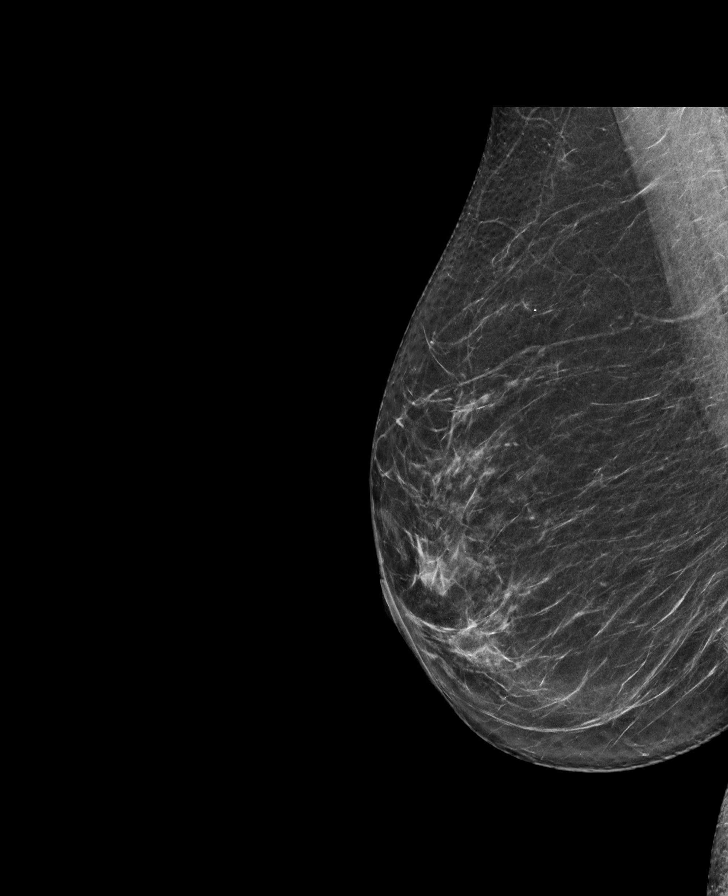

[R CC tomo · tomo slice 35/68.0]
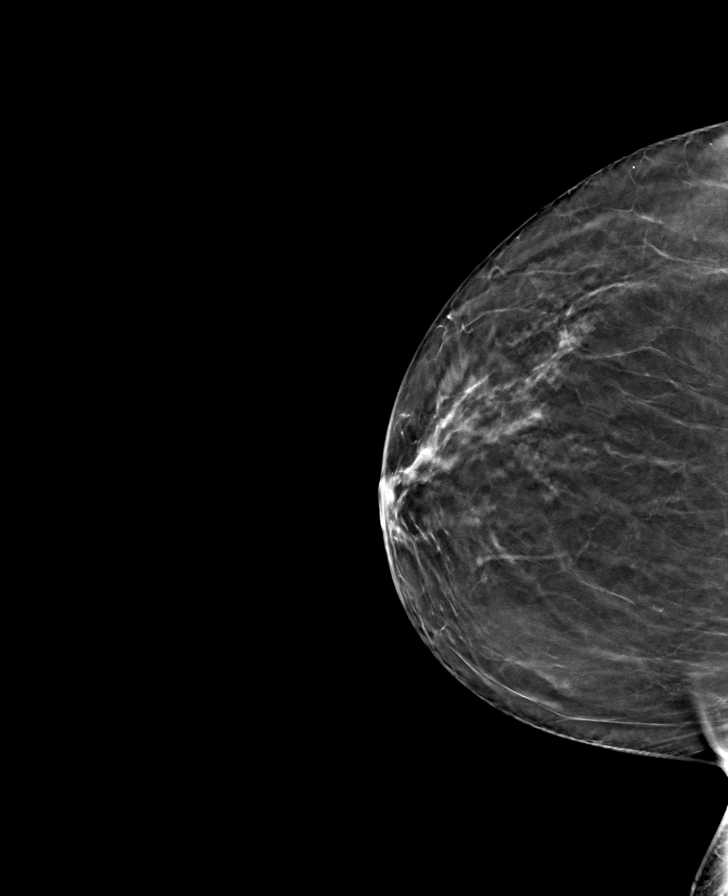

[L CC tomo · tomo slice 35/70.0]
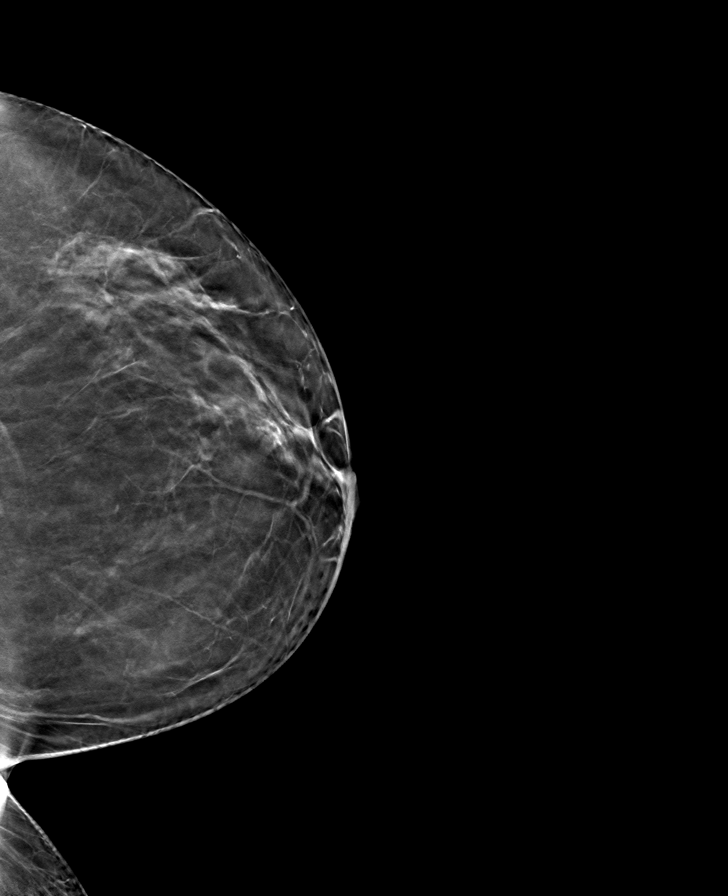

[L MLO tomo · tomo slice 37/73.0]
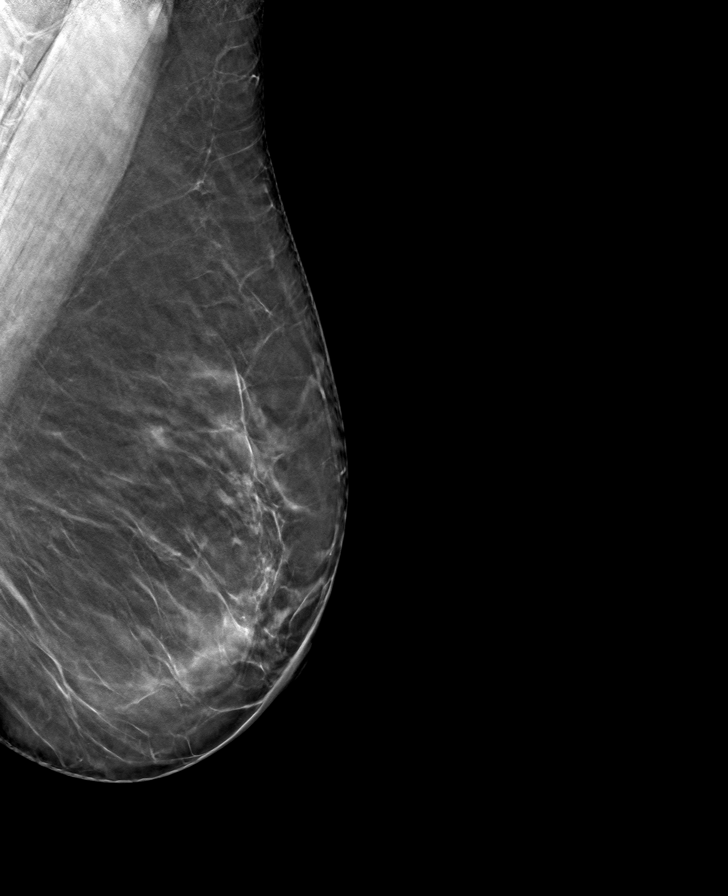

[R MLO tomo · tomo slice 36/71.0]
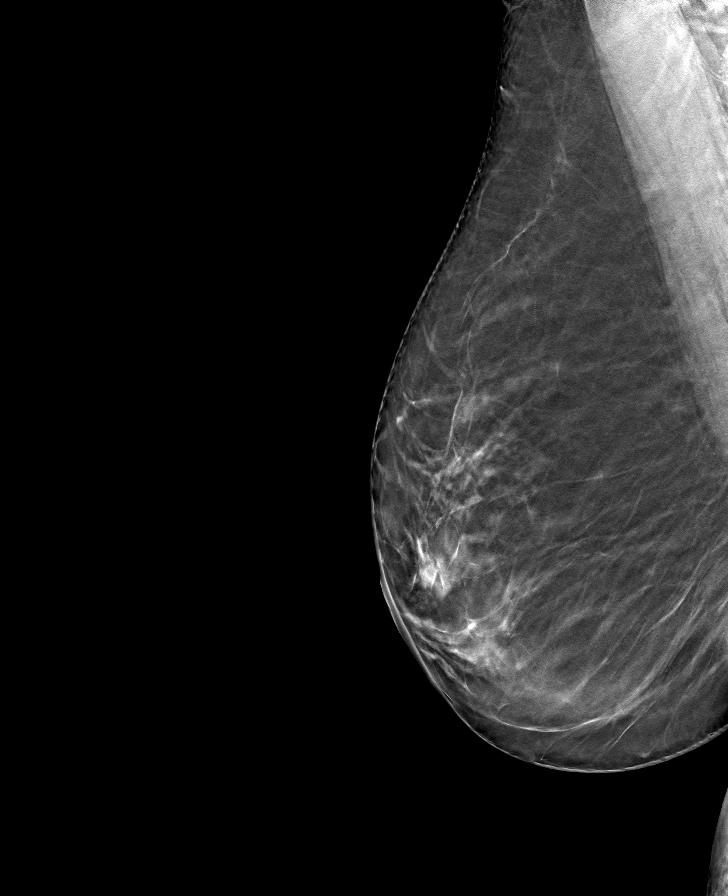

[8 of 24 positions shown; findings below may reference images not displayed]

ACR Breast Density Category b: There are scattered areas of
fibroglandular density.
FINDINGS: There are no findings suspicious for malignancy. Images were
processed with CAD.
IMPRESSION: No mammographic evidence of malignancy. A result letter of this
screening mammogram will be mailed directly to the patient.

RECOMMENDATION:
Screening mammogram in one year. (Code:CN-U-775)

BI-RADS CATEGORY  1: Negative.

## 2021-11-29 ENCOUNTER — Ambulatory Visit (INDEPENDENT_AMBULATORY_CARE_PROVIDER_SITE_OTHER): Payer: BC Managed Care – PPO | Admitting: Nurse Practitioner

## 2021-11-29 ENCOUNTER — Encounter: Payer: Self-pay | Admitting: Nurse Practitioner

## 2021-11-29 VITALS — BP 120/80 | Ht 64.0 in | Wt 148.0 lb

## 2021-11-29 DIAGNOSIS — Z01419 Encounter for gynecological examination (general) (routine) without abnormal findings: Secondary | ICD-10-CM | POA: Diagnosis not present

## 2021-11-29 DIAGNOSIS — M8589 Other specified disorders of bone density and structure, multiple sites: Secondary | ICD-10-CM

## 2021-11-29 DIAGNOSIS — Z780 Asymptomatic menopausal state: Secondary | ICD-10-CM | POA: Diagnosis not present

## 2021-11-29 NOTE — Progress Notes (Signed)
   [name] 1955-08-22 [id number]   History:  66 y.o. MWF G1 P1 presents for annual exam without GYN complaints.  Postmenopausal - no HRT, no bleeding.  Normal Pap and mammogram history.  History of osteopenia, vitamin D deficiency, and GERD, managed by PCP.    Gynecologic History No LMP recorded. Patient is postmenopausal.   Contraception/Family planning: post menopausal status Sexually active: Yes  Health Maintenance Last Pap: 10/08/2018. Results were: Normal, 5-year repeat Last mammogram: 02/09/2021. Results were: Normal Colonoscopy: 11/07/2017. Results: Benign polyp, 5-year recall Last DXA: 03/31/2020. Results: T-score-1.8, FRAX 9.4% / 1.1%  Past medical history, past surgical history, family history and social history were all reviewed and documented in the EPIC chart.  Married. New job this past year, does not love. Daughter in emergency medicine, lives in [place]. 64 yo granddaughter. Mother diagnosed with colon cancer at age 5.  ROS:  A ROS was performed and pertinent positives and negatives are included.  Exam:  Vitals:   11/29/21 0821  BP: 120/80  Weight: 148 lb (67.1 kg)  Height: '5\' 4"'$  (1.626 m)    Body mass index is 25.4 kg/m.  General appearance:  Normal Thyroid:  Symmetrical, normal in size, without palpable masses or nodularity. Respiratory  Auscultation:  Clear without wheezing or rhonchi Cardiovascular  Auscultation:  Regular rate, without rubs, murmurs or gallops  Edema/varicosities:  Not grossly evident Abdominal  Soft,nontender, without masses, guarding or rebound.  Liver/spleen:  No organomegaly noted  Hernia:  None appreciated  Skin  Inspection:  Grossly normal   Breasts: Examined lying and sitting.   Right: Without masses, retractions, discharge or axillary adenopathy.   Left: Without masses, retractions, discharge or axillary adenopathy. Genitourinary   Inguinal/mons:  Normal without inguinal adenopathy  External genitalia:  Normal appearing  vulva with no masses, tenderness, or lesions  BUS/Urethra/Skene's glands:  Normal  Vagina:  Normal appearing with normal color and discharge, no lesions  Cervix:  Normal appearing without discharge or lesions. Mild atrophic changes  Uterus:  Normal in size, shape and contour.  Midline and mobile, nontender  Adnexa/parametria:     Rt: Normal in size, without masses or tenderness.   Lt: Normal in size, without masses or tenderness.  Anus and perineum: Normal  Digital rectal exam: Normal sphincter tone without palpated masses or tenderness  Patient informed chaperone available to be present for breast and pelvic exam. Patient has requested no chaperone to be present. Patient has been advised what will be completed during breast and pelvic exam.   Assessment/Plan:  66 y.o. G1P1 for annual exam.    Well female exam with routine gynecological exam - Education provided on SBEs, importance of preventative screenings, current guidelines, high calcium diet, vitamin D supplement daily, and regular exercise including weight-bearing exercises. Labs with PCP.  Postmenopausal - no HRT, no bleeding.   Osteopenia of multiple sites - Continue daily Vitamin D supplement and high calcium diet. She cannot tolerate calcium supplements. She is now doing some weightlifting. Recommend DXA this November.   Screening for cervical cancer - Normal Pap history.  Will repeat at 5-year interval per guidelines.  Screening for breast cancer - Normal mammogram history.  Continue annual screenings.  Normal breast exam today.  Screening for colon cancer - 2019 colonoscopy. Will repeat at GI's recommended interval.   Follow up in 1 year for annual.       Tamela Gammon [facility], 8:36 AM 11/29/2021

## 2021-12-01 ENCOUNTER — Telehealth: Payer: Self-pay | Admitting: Allergy and Immunology

## 2021-12-01 NOTE — Telephone Encounter (Signed)
Patient called and stated that her prescription for Opzelura is very expensive at $230. Patient wants to know if Dr Neldon Mc can send something to her insurance company stating that this is the only medication that works for her. Patient states if insurance company approves this medication then the manufacturer will give her a discount. Patient would like a call back about this [phone].

## 2021-12-06 NOTE — Telephone Encounter (Signed)
Key:[id number]  PA for Opzelura 1.5% cream has been submitted to [facility]

## 2021-12-06 NOTE — Telephone Encounter (Signed)
I called the patient in regards to the [place] not being covered by the patient's insurance. She plans to call her insurance to see what can be done to get it covered. She stated this is the only medication that has been helping with her flares and does not want to try anything else. I will try to do a PA to see if I can get it covered. Patient plans to drop by the [place] office next week for 2 samples.

## 2022-02-17 ENCOUNTER — Ambulatory Visit (INDEPENDENT_AMBULATORY_CARE_PROVIDER_SITE_OTHER): Payer: BC Managed Care – PPO | Admitting: Sports Medicine

## 2022-02-17 ENCOUNTER — Ambulatory Visit: Payer: Self-pay

## 2022-02-17 VITALS — BP 120/70 | Ht 64.96 in

## 2022-02-17 DIAGNOSIS — M25561 Pain in right knee: Secondary | ICD-10-CM | POA: Diagnosis not present

## 2022-02-17 DIAGNOSIS — M25562 Pain in left knee: Secondary | ICD-10-CM | POA: Diagnosis not present

## 2022-02-17 NOTE — Assessment & Plan Note (Signed)
Patient was reassured that she did not do any significant structural damage She has no signs of swelling or effusion I think she probably strained the medial capsule because of abnormal biomechanics during the exercise she was doing  I suggested modifying her exercise regimen Continue to do some quadricep strengthening I reassured her she can continue all the things and we will recheck her if any problems occur

## 2022-02-17 NOTE — Progress Notes (Signed)
Chief complaint bilateral knee pain worse on the right over the past couple weeks  Patient is committed to her exercise program A couple weeks ago she was trying to swing a 45 pound kettle bell back-and-forth between her legs and a position of lumbar spine flexion This was more weight than she did use before and she felt like she was having to cheat and bend her knees and somewhat to be successful  Since then she has had pain which is resolving in the left knee but tends to localize along the medial right knee and just below the patella  Review of systems shows no significant swelling giving way or locking  Physical exam Pleasant female in no acute distress BP 120/70   Ht 5' 4.96" (1.65 m)   BMI 24.66 kg/m   Knee: RT and Lt Normal to inspection with no erythema or effusion or obvious bony abnormalities. Palpation normal with no warmth or joint line tenderness or patellar tenderness or condyle tenderness. ROM normal in flexion and extension and lower leg rotation. Ligaments with solid consistent endpoints including ACL, PCL, LCL, MCL. Negative Mcmurray's and provocative meniscal tests. Non painful patellar compression. Patellar and quadriceps tendons unremarkable. Hamstring and quadriceps strength is normal.  Ultrasound of bilateral knees Suprapatellar pouch is visualized bilaterally without any sign of effusion Quadriceps and patellar tendons were normal Medial and lateral joint line showed an intact meniscus with no significant swelling Trochlear groove was normal bilaterally  Impression ultrasound of both knees was normal  Ultrasound and interpretation by Wolfgang Phoenix. Oneida Alar, MD

## 2022-03-09 ENCOUNTER — Encounter: Payer: Self-pay | Admitting: Allergy and Immunology

## 2022-03-15 ENCOUNTER — Other Ambulatory Visit: Payer: Self-pay | Admitting: Internal Medicine

## 2022-03-15 DIAGNOSIS — Z1231 Encounter for screening mammogram for malignant neoplasm of breast: Secondary | ICD-10-CM

## 2022-03-20 DIAGNOSIS — Z23 Encounter for immunization: Secondary | ICD-10-CM | POA: Diagnosis not present

## 2022-04-19 ENCOUNTER — Ambulatory Visit (INDEPENDENT_AMBULATORY_CARE_PROVIDER_SITE_OTHER): Payer: BC Managed Care – PPO | Admitting: Sports Medicine

## 2022-04-19 VITALS — BP 106/78 | Ht 64.57 in | Wt 147.0 lb

## 2022-04-19 DIAGNOSIS — M533 Sacrococcygeal disorders, not elsewhere classified: Secondary | ICD-10-CM

## 2022-04-19 NOTE — Assessment & Plan Note (Signed)
Suspected SI joint dysfunction secondary to seated posture and prolonged airline flight.  Patient should be able to return to normal exercise regimen as tolerated.  She should perform the exercises and stretches regularly until her pain has resolved.  She may continue with lidocaine patches as needed.  Follow-up if no improvement or worsening of her symptoms of the next 4 weeks. If she has no improvement we can consider an injection however we will hold off at this time.

## 2022-04-19 NOTE — Progress Notes (Unsigned)
   Established Patient Office Visit  Subjective   Patient ID: [name], female    DOB: December 30, 1955  Age: 66 y.o. MRN: [id number]  Left-sided low back pain.  She presents today with chief complaint of left-sided low back pain for the past 2 weeks that comes and goes.  She first noticed the pain when she was lifting her grandchild and twisting.  She took some ibuprofen which seemed to alleviate her pain however it returned a couple days later without any injury while she was sitting in a beachchair.  She has been unable to pinpoint any specific activities that cause a flare of her pain.  She gets some relief of her pain with lidocaine patches and ibuprofen however too much ibuprofen, Aleve does tend to upset her stomach.  She denies any radiation of pain down her back or into her legs.  She has been trying MELT method therapy with minimal improvement.  She has not been able to return to her normal workout activities as she is unsure what will aggravate her pain.  She is here today to see what stretches and exercises she should be doing to improve.  Of note her pain began after a 9 hour flight to [place].   ROS as listed above in HPI    Objective:     BP 106/78   Ht 5' 4.57" (1.64 m)   Wt 147 lb (66.7 kg)   BMI 24.79 kg/m  Physical Exam Vitals reviewed.  Constitutional:      General: She is not in acute distress.    Appearance: Normal appearance. She is normal weight. She is not ill-appearing, toxic-appearing or diaphoretic.  HENT:     Head: Normocephalic.  Pulmonary:     Effort: Pulmonary effort is normal.  Neurological:     Mental Status: She is alert.   Left low back: No obvious deformity or asymmetry.  No tenderness to palpation of the midline lumbar spinous processes.  No tenderness to the quadratus lumborum or paraspinals.  Tenderness to palpation at the anterior SI joint.  Slight tenderness to palpation of the greater trochanter. No reproduction of her pain with FADER. Good  motion with anterior to posterior compression of the SI joint. Negative seated straight leg raise.     Assessment & Plan:   Problem List Items Addressed This Visit       Musculoskeletal and Integument   SI (sacroiliac) joint dysfunction - Primary    Suspected SI joint dysfunction secondary to seated posture and prolonged airline flight.  Patient should be able to return to normal exercise regimen as tolerated.  She should perform the exercises and stretches regularly until her pain has resolved.  She may continue with lidocaine patches as needed.  Follow-up if no improvement or worsening of her symptoms of the next 4 weeks. If she has no improvement we can consider an injection however we will hold off at this time.       Return if symptoms worsen or fail to improve.    Elmore Guise, DO  I observed and examined the patient with the [facility] resident and agree with assessment and plan.  Note reviewed and modified by me. Ila Mcgill, MD

## 2022-05-09 ENCOUNTER — Ambulatory Visit
Admission: RE | Admit: 2022-05-09 | Discharge: 2022-05-09 | Disposition: A | Discharge status: Home / Self Care | Payer: BC Managed Care – PPO | Source: Ambulatory Visit | Attending: Internal Medicine | Admitting: Internal Medicine

## 2022-05-09 DIAGNOSIS — Z1231 Encounter for screening mammogram for malignant neoplasm of breast: Secondary | ICD-10-CM

## 2022-05-10 ENCOUNTER — Ambulatory Visit: Payer: BC Managed Care – PPO | Admitting: Sports Medicine

## 2022-06-06 ENCOUNTER — Telehealth: Payer: Self-pay

## 2022-06-06 ENCOUNTER — Other Ambulatory Visit (HOSPITAL_COMMUNITY): Payer: Self-pay

## 2022-06-06 NOTE — Telephone Encounter (Signed)
Patient Advocate Encounter   Received notification from [employer] [facility] that prior authorization is required for Opzelura 1.5% cream  Submitted: 06-06-2022 Key [id number]   Status is pending

## 2022-06-08 NOTE — Telephone Encounter (Signed)
PA for Opzelura 1.5% cream has been DENIED.   No additional information provided, denial notice from insurance has been attached to patient documents.

## 2022-06-08 NOTE — Telephone Encounter (Signed)
Pt hasn't been seen in over a year so legally can not get a refill so she is schduled for march 5th at 32 with dr Neldon Mc in [place] [facility]

## 2022-07-01 ENCOUNTER — Ambulatory Visit: Payer: BC Managed Care – PPO

## 2022-07-11 DIAGNOSIS — M25552 Pain in left hip: Secondary | ICD-10-CM | POA: Diagnosis not present

## 2022-07-21 ENCOUNTER — Ambulatory Visit (INDEPENDENT_AMBULATORY_CARE_PROVIDER_SITE_OTHER): Payer: BC Managed Care – PPO | Admitting: Internal Medicine

## 2022-07-21 ENCOUNTER — Encounter: Payer: Self-pay | Admitting: Internal Medicine

## 2022-07-21 VITALS — BP 118/72 | HR 74 | Temp 98.1°F | Ht 64.0 in | Wt 151.0 lb

## 2022-07-21 DIAGNOSIS — E538 Deficiency of other specified B group vitamins: Secondary | ICD-10-CM

## 2022-07-21 DIAGNOSIS — E785 Hyperlipidemia, unspecified: Secondary | ICD-10-CM | POA: Diagnosis not present

## 2022-07-21 DIAGNOSIS — E559 Vitamin D deficiency, unspecified: Secondary | ICD-10-CM | POA: Diagnosis not present

## 2022-07-21 DIAGNOSIS — Z0000 Encounter for general adult medical examination without abnormal findings: Secondary | ICD-10-CM

## 2022-07-21 NOTE — Progress Notes (Signed)
Subjective:  Patient ID: [name], female    DOB: 1955-12-30  Age: 67 y.o. MRN: [id number]  CC: No chief complaint on file.   HPI [name] presents for a well exam  Outpatient Medications Prior to Visit  Medication Sig Dispense Refill   cetirizine (ZYRTEC) 10 MG tablet Take 1 tablet (10 mg total) by mouth daily. 30 tablet 1   Cholecalciferol (EQL VITAMIN D3) 1000 units tablet Take 2 tablets (2,000 Units total) by mouth daily. 2 tabs po daily 100 tablet 3   fluticasone (FLONASE) 50 MCG/ACT nasal spray Place 2 sprays into both nostrils daily. 16 g 0   ibuprofen (ADVIL) 800 MG tablet Take 1 tab twice daily with food for 7-10 days. Then take as needed. 40 tablet 0   levocetirizine (XYZAL) 5 MG tablet Take 1 tablet (5 mg total) by mouth every evening. 15 tablet 0   Ruxolitinib Phosphate (OPZELURA) 1.5 % CREA Apply 2 application topically in the morning and at bedtime. apply two times per day to posterior elbow 60 g 8   AUVI-Q 0.3 MG/0.3ML SOAJ injection Use as directed for life-threatening allergic reaction. 2 each 3   betamethasone dipropionate (DIPROLENE) 0.05 % ointment Apply 1 application topically daily. Apply 1-2 times per day (Patient not taking: Reported on 07/21/2022) 30 g 1   pimecrolimus (ELIDEL) 1 % cream APPLY TO SKIN ONE TO TWO TIMES DAILY AS DIRECTED. (Patient not taking: Reported on 07/21/2022) 30 g 5   zolpidem (AMBIEN) 5 MG tablet Take 1 tablet (5 mg total) by mouth at bedtime as needed. for sleep (Patient not taking: Reported on 07/21/2022) 30 tablet 2   No facility-administered medications prior to visit.    ROS: Review of Systems  Constitutional:  Negative for activity change, appetite change, chills, fatigue and unexpected weight change.  HENT:  Negative for congestion, mouth sores and sinus pressure.   Eyes:  Negative for visual disturbance.  Respiratory:  Negative for cough and chest tightness.   Gastrointestinal:  Negative for abdominal pain and nausea.   Genitourinary:  Negative for difficulty urinating, frequency and vaginal pain.  Musculoskeletal:  Negative for back pain and gait problem.  Skin:  Negative for pallor and rash.  Neurological:  Negative for dizziness, tremors, weakness, numbness and headaches.  Psychiatric/Behavioral:  Negative for confusion and sleep disturbance.     Objective:  BP 118/72 (BP Location: Left Arm, Patient Position: Sitting, Cuff Size: Normal)   Pulse 74   Temp 98.1 F (36.7 C) (Oral)   Ht '5\' 4"'$  (1.626 m)   Wt 151 lb (68.5 kg)   SpO2 100%   BMI 25.92 kg/m   BP Readings from Last 3 Encounters:  07/21/22 118/72  04/19/22 106/78  02/17/22 120/70    Wt Readings from Last 3 Encounters:  07/21/22 151 lb (68.5 kg)  04/19/22 147 lb (66.7 kg)  11/29/21 148 lb (67.1 kg)    Physical Exam Constitutional:      General: She is not in acute distress.    Appearance: She is well-developed.  HENT:     Head: Normocephalic.     Right Ear: External ear normal.     Left Ear: External ear normal.     Nose: Nose normal.  Eyes:     General:        Right eye: No discharge.        Left eye: No discharge.     Conjunctiva/sclera: Conjunctivae normal.     Pupils: Pupils are equal, round,  and reactive to light.  Neck:     Thyroid: No thyromegaly.     Vascular: No JVD.     Trachea: No tracheal deviation.  Cardiovascular:     Rate and Rhythm: Normal rate and regular rhythm.     Heart sounds: Normal heart sounds.  Pulmonary:     Effort: No respiratory distress.     Breath sounds: No stridor. No wheezing.  Abdominal:     General: Bowel sounds are normal. There is no distension.     Palpations: Abdomen is soft. There is no mass.     Tenderness: There is no abdominal tenderness. There is no guarding or rebound.  Musculoskeletal:        General: No tenderness.     Cervical back: Normal range of motion and neck supple. No rigidity.  Lymphadenopathy:     Cervical: No cervical adenopathy.  Skin:    Findings:  No erythema or rash.  Neurological:     Cranial Nerves: No cranial nerve deficit.     Motor: No abnormal muscle tone.     Coordination: Coordination normal.     Deep Tendon Reflexes: Reflexes normal.  Psychiatric:        Behavior: Behavior normal.        Thought Content: Thought content normal.        Judgment: Judgment normal.     Lab Results  Component Value Date   WBC 5.5 11/11/2021   HGB 13.5 11/11/2021   HCT 40.4 11/11/2021   PLT 172.0 11/11/2021   GLUCOSE 94 11/11/2021   CHOL 204 (H) 11/11/2021   TRIG 59.0 11/11/2021   HDL 63.10 11/11/2021   LDLCALC 129 (H) 11/11/2021   ALT 19 11/11/2021   AST 17 11/11/2021   NA 138 11/11/2021   K 4.3 11/11/2021   CL 103 11/11/2021   CREATININE 0.84 11/11/2021   BUN 14 11/11/2021   CO2 29 11/11/2021   TSH 3.23 11/11/2021    No results found.  Assessment & Plan:   Problem List Items Addressed This Visit       Other   Well adult exam - Primary    We discussed age appropriate health related issues, including available/recomended screening tests and vaccinations. We discussed a need for adhering to healthy diet and exercise. Labs/EKG were reviewed/ordered. All questions were answered. She has a GYN here Jonelle Sidle, NP. Ophth - Dr Satira Sark. Colon up to date. Colon - 2019, next due 2024 (polyp). A cardiac CT scan for coronary calcium offered again -will schedule Shingrix suggested      Relevant Orders   TSH   Urinalysis   CBC with Differential/Platelet   Lipid panel   Comprehensive metabolic panel   Vitamin [id number]   VITAMIN D 25 Hydroxy (Vit-D Deficiency, Fractures)   Vitamin D deficiency   Relevant Orders   VITAMIN D 25 Hydroxy (Vit-D Deficiency, Fractures)   B12 deficiency    On B12      Relevant Orders   Vitamin B12   Other Visit Diagnoses     Dyslipidemia       Relevant Orders   CT CARDIAC SCORING (SELF PAY ONLY)         No orders of the defined types were placed in this encounter.     Follow-up: Return in  about 1 year (around 07/22/2023) for a follow-up visit.  Walker Kehr, MD

## 2022-07-21 NOTE — Patient Instructions (Addendum)
Blue-Emu cream was recommended to use 2-3 times a day    Cardiac CT calcium scoring test $99    Computed tomography, more commonly known as a CT or CAT scan, is a diagnostic medical imaging test. Like traditional x-rays, it produces multiple images or pictures of the inside of the body. The cross-sectional images generated during a CT scan can be reformatted in multiple planes. They can even generate three-dimensional images. These images can be viewed on a computer monitor. CT images of internal organs, bones, soft tissue and blood vessels provide greater detail than traditional x-rays, particularly of soft tissues and blood vessels. A cardiac CT scan for coronary calcium is a non-invasive way of obtaining information about the presence, location and extent of calcified plaque in the coronary arteries--the vessels that supply oxygen-containing blood to the heart muscle. Calcified plaque results when there is a build-up of fat and other substances under the inner layer of the artery. This material can calcify which signals the presence of atherosclerosis, a disease of the vessel wall, also called coronary artery disease (CAD). People with this disease have an increased risk for heart attacks. In addition, over time, progression of plaque build up (CAD) can narrow the arteries or even close off blood flow to the heart. The result may be chest pain, sometimes called "angina," or a heart attack. Because calcium is a marker of CAD, the amount of calcium detected on a cardiac CT scan is a helpful prognostic tool. The findings on cardiac CT are expressed as a calcium score. Another name for this test is coronary artery calcium scoring.  What are some common uses of the procedure? The goal of cardiac CT scan for calcium scoring is to determine if CAD is present and to what extent, even if there are no symptoms. It is a screening study that may be recommended by a physician for patients with risk factors for  CAD but no clinical symptoms. The major risk factors for CAD are: high blood cholesterol levels  family history of heart attacks  diabetes  high blood pressure  cigarette smoking  overweight or obese  physical inactivity   A negative cardiac CT scan for calcium scoring shows no calcification within the coronary arteries. This suggests that CAD is absent or so minimal it cannot be seen by this technique. The chance of having a heart attack over the next two to five years is very low under these circumstances. A positive test means that CAD is present, regardless of whether or not the patient is experiencing any symptoms. The amount of calcification--expressed as the calcium score--may help to predict the likelihood of a myocardial infarction (heart attack) in the coming years and helps your medical doctor or cardiologist decide whether the patient may need to take preventive medicine or undertake other measures such as diet and exercise to lower the risk for heart attack. The extent of CAD is graded according to your calcium score:  Calcium Score  Presence of CAD (coronary artery disease)  0 No evidence of CAD   1-10 Minimal evidence of CAD  11-100 Mild evidence of CAD  101-400 Moderate evidence of CAD  Over 400 Extensive evidence of CAD   Coronary artery calcium (CAC) score is a strong predictor of incident coronary heart disease (CHD) and provides predictive information beyond traditional risk factors. CAC scoring is reasonable to use in the decision to withhold, postpone, or initiate statin therapy in intermediate-risk or selected borderline-risk asymptomatic adults (age 43-75 years and  LDL-C >=70 to <190 mg/dL) who do not have diabetes or established atherosclerotic cardiovascular disease (ASCVD).* In intermediate-risk (10-year ASCVD risk >=7.5% to <20%) adults or selected borderline-risk (10-year ASCVD risk >=5% to <7.5%) adults in whom a CAC score is measured for the purpose of  making a treatment decision the following recommendations have been made:   If CAC=0, it is reasonable to withhold statin therapy and reassess in 5 to 10 years, as long as higher risk conditions are absent (diabetes mellitus, family history of premature CHD in first degree relatives (males <55 years; females <65 years), cigarette smoking, or LDL >=190 mg/dL).   If CAC is 1 to 99, it is reasonable to initiate statin therapy for patients >=2 years of age.   If CAC is >=100 or >=75th percentile, it is reasonable to initiate statin therapy at any age.   Cardiology referral should be considered for patients with CAC scores >=400 or >=75th percentile.   *2018 AHA/ACC/AACVPR/AAPA/ABC/ACPM/ADA/AGS/APhA/ASPC/NLA/PCNA Guideline on the Management of Blood Cholesterol: A Report of the American College of Cardiology/American Heart Association Task Force on Clinical Practice Guidelines. J Am Coll Cardiol. 2019;73(24):3168-3209.

## 2022-07-21 NOTE — Assessment & Plan Note (Signed)
On B12 

## 2022-07-21 NOTE — Assessment & Plan Note (Signed)
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

## 2022-07-21 NOTE — Assessment & Plan Note (Addendum)
We discussed age appropriate health related issues, including available/recomended screening tests and vaccinations. We discussed a need for adhering to healthy diet and exercise. Labs/EKG were reviewed/ordered. All questions were answered. She has a GYN here Jonelle Sidle, NP. Ophth - Dr Satira Sark. Colon up to date. Colon - 2019, next due 2024 (polyp). A cardiac CT scan for coronary calcium offered again -will schedule Shingrix suggested

## 2022-07-25 ENCOUNTER — Encounter: Payer: Self-pay | Admitting: Internal Medicine

## 2022-08-02 ENCOUNTER — Encounter: Payer: Self-pay | Admitting: Allergy and Immunology

## 2022-08-02 ENCOUNTER — Ambulatory Visit (INDEPENDENT_AMBULATORY_CARE_PROVIDER_SITE_OTHER): Payer: Medicare Other | Admitting: Allergy and Immunology

## 2022-08-02 ENCOUNTER — Other Ambulatory Visit: Payer: Self-pay

## 2022-08-02 VITALS — BP 118/78 | HR 74 | Temp 98.0°F | Resp 16 | Ht 64.96 in | Wt 150.8 lb

## 2022-08-02 DIAGNOSIS — L989 Disorder of the skin and subcutaneous tissue, unspecified: Secondary | ICD-10-CM | POA: Diagnosis not present

## 2022-08-02 DIAGNOSIS — T7800XD Anaphylactic reaction due to unspecified food, subsequent encounter: Secondary | ICD-10-CM

## 2022-08-02 DIAGNOSIS — T7800XA Anaphylactic reaction due to unspecified food, initial encounter: Secondary | ICD-10-CM

## 2022-08-02 MED ORDER — OPZELURA 1.5 % EX CREA: 2.0000 "application " | TOPICAL_CREAM | Freq: Two times a day (BID) | CUTANEOUS | 11 refills | Status: DC

## 2022-08-02 MED ORDER — AUVI-Q 0.3 MG/0.3ML IJ SOAJ: 0.3000 mg | INTRAMUSCULAR | 1 refills | Status: AC | PRN

## 2022-08-02 MED ORDER — CETIRIZINE HCL 10 MG PO TABS: 10.0000 mg | ORAL_TABLET | Freq: Every day | ORAL | 3 refills | Status: AC | PRN

## 2022-08-02 NOTE — Progress Notes (Unsigned)
[facility] [place] [facility] [place]   Follow-up Note  Referring Provider: Plotnikov, Evie Lacks, MD Primary Provider: Cassandria Anger, MD Date of Office Visit: 08/02/2022  Subjective:   [name] (DOB: [birth year]) is a 67 y.o. female who returns to the [facility] on 08/02/2022 in re-evaluation of the following:  HPI: [name] returns to this clinic in evaluation of atopic dermatitis and food allergy directed against moldy dairy foods and fish.  Whenever she has a flare of her dermatitis she will apply topical Jak inhibitor and this results in dramatic improvement regarding the issues currently involving her left upper arm and her left ankle.  If she uses a topical Jak inhibitor it will work for several weeks in eliminating itchiness and redness.  She remains away from consumption of any type of blue cheese or any cheese type of cheese that has mold and red fish.  Allergies as of 08/02/2022       Reactions   Penicillins Anaphylaxis   REACTION: Anyphylactic shock        Medication List    Auvi-Q 0.3 mg/0.3 mL Soaj injection Generic drug: EPINEPHrine Use as directed for life-threatening allergic reaction.   betamethasone dipropionate 0.05 % ointment Commonly known as: DIPROLENE Apply 1 application topically daily. Apply 1-2 times per day   cetirizine 10 MG tablet Commonly known as: ZYRTEC Take 1 tablet (10 mg total) by mouth daily.   Cholecalciferol 25 MCG (1000 UT) tablet Commonly known as: EQL Vitamin D3 Take 2 tablets (2,000 Units total) by mouth daily. 2 tabs po daily   fluticasone 50 MCG/ACT nasal spray Commonly known as: FLONASE Place 2 sprays into both nostrils daily.   ibuprofen 800 MG tablet Commonly known as: ADVIL Take 1 tab twice daily with food for 7-10 days. Then take as needed.   levocetirizine 5 MG tablet Commonly known as: XYZAL Take 1 tablet (5 mg total) by mouth every evening.   Opzelura 1.5 %  Crea Generic drug: Ruxolitinib Phosphate Apply 2 application topically in the morning and at bedtime. apply two times per day to posterior elbow   pimecrolimus 1 % cream Commonly known as: ELIDEL APPLY TO SKIN ONE TO TWO TIMES DAILY AS DIRECTED.    Past Medical History:  Diagnosis Date   Allergy    GI upset from allergies to fish etc   Anemia    long ago- past hx    Cataract    mild   GERD (gastroesophageal reflux disease)    Hereditary angioedema (HCC)    high IgE levels   Hives    history of- cold exposure related   Hx of small bowel obstruction    due to hereditary andioedema- triggered by salmon    Osteopenia 07/2017   T score -1.9 FRAX 9% / 1.1%    Past Surgical History:  Procedure Laterality Date   COLONOSCOPY  2009   DB    EXPLORATORY LAPAROTOMY     UPPER GASTROINTESTINAL ENDOSCOPY      Review of systems negative except as noted in HPI / PMHx or noted below:  Review of Systems  Constitutional: Negative.   HENT: Negative.    Eyes: Negative.   Respiratory: Negative.    Cardiovascular: Negative.   Gastrointestinal: Negative.   Genitourinary: Negative.   Musculoskeletal: Negative.   Skin: Negative.   Neurological: Negative.   Endo/Heme/Allergies: Negative.   Psychiatric/Behavioral: Negative.       Objective:   Vitals:  08/02/22 1528  BP: 118/78  Pulse: 74  Resp: 16  Temp: 98 F (36.7 C)  SpO2: 96%   Height: 5' 4.96" (165 cm)  Weight: 150 lb 12.8 oz (68.4 kg)   Physical Exam Skin:    Findings: Rash (1 cm diameter slightly erythematous blotch left upper arm.  Medial ankle all with slightly scaly 0.5 cm lesion around malleolus) present.     Diagnostics: none  Assessment and Plan:   1. Inflammatory dermatosis   2. Allergy with anaphylaxis due to food    1.  Treat skin inflammation with the following during flare-ups:   A. Opzelura - 1-2 times per day  2. Can continue Zyrtec if needed  3. Auvi-Q 0.3, benadryl, MD/ER evaluation for  allergic reaction  4. Return to clinic in 12 months or earlier if problem  [name] is doing very well on a topical Jak inhibitor and I have given her sample of this medication as it appears as though 1 sample usually takes care of the entire year of application.  I will see her back in this clinic as needed regarding the skin issue.  Certainly she is not going to eat moldy dairy products or red fish given her previous experience with these consumptions.  Allena Katz, MD Allergy / Immunology [place]

## 2022-08-02 NOTE — Patient Instructions (Addendum)
  1.  Treat skin inflammation with the following during flare-ups:   A. Opzelura - 1-2 times per day  2. Can continue Zyrtec if needed  3. Auvi-Q 0.3, benadryl, MD/ER evaluation for allergic reaction  4. Return to clinic in 12 months or earlier if problem

## 2022-08-03 ENCOUNTER — Encounter: Payer: Self-pay | Admitting: Allergy and Immunology

## 2022-08-09 DIAGNOSIS — H524 Presbyopia: Secondary | ICD-10-CM | POA: Diagnosis not present

## 2022-08-19 ENCOUNTER — Ambulatory Visit
Admission: RE | Admit: 2022-08-19 | Discharge: 2022-08-19 | Disposition: A | Discharge status: Home / Self Care | Payer: Medicare Other | Source: Ambulatory Visit | Attending: Internal Medicine | Admitting: Internal Medicine

## 2022-08-19 DIAGNOSIS — Z1231 Encounter for screening mammogram for malignant neoplasm of breast: Secondary | ICD-10-CM | POA: Diagnosis not present

## 2022-08-26 ENCOUNTER — Ambulatory Visit (HOSPITAL_BASED_OUTPATIENT_CLINIC_OR_DEPARTMENT_OTHER)
Admission: RE | Admit: 2022-08-26 | Discharge: 2022-08-26 | Disposition: A | Discharge status: Home / Self Care | Payer: BC Managed Care – PPO | Source: Ambulatory Visit | Attending: Internal Medicine | Admitting: Internal Medicine

## 2022-08-26 DIAGNOSIS — E785 Hyperlipidemia, unspecified: Secondary | ICD-10-CM | POA: Insufficient documentation

## 2022-11-15 DIAGNOSIS — M9901 Segmental and somatic dysfunction of cervical region: Secondary | ICD-10-CM | POA: Diagnosis not present

## 2022-11-15 DIAGNOSIS — M25512 Pain in left shoulder: Secondary | ICD-10-CM | POA: Diagnosis not present

## 2022-11-15 DIAGNOSIS — M25511 Pain in right shoulder: Secondary | ICD-10-CM | POA: Diagnosis not present

## 2022-11-15 DIAGNOSIS — M9902 Segmental and somatic dysfunction of thoracic region: Secondary | ICD-10-CM | POA: Diagnosis not present

## 2022-11-15 DIAGNOSIS — M9908 Segmental and somatic dysfunction of rib cage: Secondary | ICD-10-CM | POA: Diagnosis not present

## 2022-11-24 ENCOUNTER — Other Ambulatory Visit (INDEPENDENT_AMBULATORY_CARE_PROVIDER_SITE_OTHER): Payer: Medicare Other

## 2022-11-24 DIAGNOSIS — Z0000 Encounter for general adult medical examination without abnormal findings: Secondary | ICD-10-CM | POA: Diagnosis not present

## 2022-11-24 DIAGNOSIS — E559 Vitamin D deficiency, unspecified: Secondary | ICD-10-CM | POA: Diagnosis not present

## 2022-11-24 DIAGNOSIS — E538 Deficiency of other specified B group vitamins: Secondary | ICD-10-CM | POA: Diagnosis not present

## 2022-11-24 LAB — CBC WITH DIFFERENTIAL/PLATELET
Basophils Absolute: 0 10*3/uL (ref 0.0–0.1)
Basophils Relative: 0.7 % (ref 0.0–3.0)
Eosinophils Absolute: 0.4 10*3/uL (ref 0.0–0.7)
Eosinophils Relative: 6.6 % — ABNORMAL HIGH (ref 0.0–5.0)
HCT: 41.8 % (ref 36.0–46.0)
Hemoglobin: 13.8 g/dL (ref 12.0–15.0)
Lymphocytes Relative: 33 % (ref 12.0–46.0)
Lymphs Abs: 2.2 10*3/uL (ref 0.7–4.0)
MCHC: 33 g/dL (ref 30.0–36.0)
MCV: 91.7 fl (ref 78.0–100.0)
Monocytes Absolute: 0.4 10*3/uL (ref 0.1–1.0)
Monocytes Relative: 5.5 % (ref 3.0–12.0)
Neutro Abs: 3.6 10*3/uL (ref 1.4–7.7)
Neutrophils Relative %: 54.2 % (ref 43.0–77.0)
Platelets: 207 10*3/uL (ref 150.0–400.0)
RBC: 4.56 Mil/uL (ref 3.87–5.11)
RDW: 13.2 % (ref 11.5–15.5)
WBC: 6.6 10*3/uL (ref 4.0–10.5)

## 2022-11-24 LAB — URINALYSIS, ROUTINE W REFLEX MICROSCOPIC
Bilirubin Urine: NEGATIVE
Hgb urine dipstick: NEGATIVE
Ketones, ur: NEGATIVE
Nitrite: NEGATIVE
Specific Gravity, Urine: 1.01 (ref 1.000–1.030)
Total Protein, Urine: NEGATIVE
Urine Glucose: NEGATIVE
Urobilinogen, UA: 0.2 (ref 0.0–1.0)
pH: 7.5 (ref 5.0–8.0)

## 2022-11-24 LAB — LIPID PANEL
Cholesterol: 227 mg/dL — ABNORMAL HIGH (ref 0–200)
HDL: 66 mg/dL (ref >39.00)
LDL Cholesterol: 146 mg/dL — ABNORMAL HIGH (ref 0–99)
NonHDL: 160.93
Total CHOL/HDL Ratio: 3
Triglycerides: 75 mg/dL (ref 0.0–149.0)
VLDL: 15 mg/dL (ref 0.0–40.0)

## 2022-11-24 LAB — COMPREHENSIVE METABOLIC PANEL
ALT: 22 U/L (ref 0–35)
AST: 19 U/L (ref 0–37)
Albumin: 4.4 g/dL (ref 3.5–5.2)
Alkaline Phosphatase: 68 U/L (ref 39–117)
BUN: 12 mg/dL (ref 6–23)
CO2: 30 mEq/L (ref 19–32)
Calcium: 9.7 mg/dL (ref 8.4–10.5)
Chloride: 104 mEq/L (ref 96–112)
Creatinine, Ser: 0.74 mg/dL (ref 0.40–1.20)
GFR: 84.98 mL/min (ref >60.00)
Glucose, Bld: 92 mg/dL (ref 70–99)
Potassium: 4.1 mEq/L (ref 3.5–5.1)
Sodium: 143 mEq/L (ref 135–145)
Total Bilirubin: 0.6 mg/dL (ref 0.2–1.2)
Total Protein: 7.3 g/dL (ref 6.0–8.3)

## 2022-11-24 LAB — TSH: TSH: 3.33 u[IU]/mL (ref 0.35–5.50)

## 2022-11-24 LAB — VITAMIN D 25 HYDROXY (VIT D DEFICIENCY, FRACTURES): VITD: 26.13 ng/mL — ABNORMAL LOW (ref 30.00–100.00)

## 2022-11-24 LAB — VITAMIN B12: Vitamin B-12: 336 pg/mL (ref 211–911)

## 2022-11-29 DIAGNOSIS — M9908 Segmental and somatic dysfunction of rib cage: Secondary | ICD-10-CM | POA: Diagnosis not present

## 2022-11-29 DIAGNOSIS — M25511 Pain in right shoulder: Secondary | ICD-10-CM | POA: Diagnosis not present

## 2022-11-29 DIAGNOSIS — M9907 Segmental and somatic dysfunction of upper extremity: Secondary | ICD-10-CM | POA: Diagnosis not present

## 2022-11-29 DIAGNOSIS — M9901 Segmental and somatic dysfunction of cervical region: Secondary | ICD-10-CM | POA: Diagnosis not present

## 2022-12-12 ENCOUNTER — Ambulatory Visit: Payer: Medicare Other | Admitting: Internal Medicine

## 2022-12-13 DIAGNOSIS — M9901 Segmental and somatic dysfunction of cervical region: Secondary | ICD-10-CM | POA: Diagnosis not present

## 2022-12-13 DIAGNOSIS — M9908 Segmental and somatic dysfunction of rib cage: Secondary | ICD-10-CM | POA: Diagnosis not present

## 2022-12-13 DIAGNOSIS — M542 Cervicalgia: Secondary | ICD-10-CM | POA: Diagnosis not present

## 2022-12-13 DIAGNOSIS — M25561 Pain in right knee: Secondary | ICD-10-CM | POA: Diagnosis not present

## 2022-12-20 DIAGNOSIS — M79645 Pain in left finger(s): Secondary | ICD-10-CM | POA: Diagnosis not present

## 2023-01-12 DIAGNOSIS — M25551 Pain in right hip: Secondary | ICD-10-CM | POA: Diagnosis not present

## 2023-01-12 DIAGNOSIS — M9904 Segmental and somatic dysfunction of sacral region: Secondary | ICD-10-CM | POA: Diagnosis not present

## 2023-01-12 DIAGNOSIS — M9903 Segmental and somatic dysfunction of lumbar region: Secondary | ICD-10-CM | POA: Diagnosis not present

## 2023-01-12 DIAGNOSIS — M9902 Segmental and somatic dysfunction of thoracic region: Secondary | ICD-10-CM | POA: Diagnosis not present

## 2023-01-17 ENCOUNTER — Other Ambulatory Visit (HOSPITAL_COMMUNITY)
Admission: RE | Admit: 2023-01-17 | Discharge: 2023-01-17 | Disposition: A | Discharge status: Home / Self Care | Payer: Medicare Other | Source: Ambulatory Visit | Attending: Nurse Practitioner | Admitting: Nurse Practitioner

## 2023-01-17 ENCOUNTER — Encounter: Payer: Self-pay | Admitting: Nurse Practitioner

## 2023-01-17 ENCOUNTER — Ambulatory Visit (INDEPENDENT_AMBULATORY_CARE_PROVIDER_SITE_OTHER): Payer: Medicare Other | Admitting: Nurse Practitioner

## 2023-01-17 VITALS — BP 122/68 | HR 72 | Ht 64.96 in | Wt 150.0 lb

## 2023-01-17 DIAGNOSIS — N9489 Other specified conditions associated with female genital organs and menstrual cycle: Secondary | ICD-10-CM | POA: Diagnosis not present

## 2023-01-17 DIAGNOSIS — Z1151 Encounter for screening for human papillomavirus (HPV): Secondary | ICD-10-CM | POA: Insufficient documentation

## 2023-01-17 DIAGNOSIS — Z780 Asymptomatic menopausal state: Secondary | ICD-10-CM

## 2023-01-17 DIAGNOSIS — Z124 Encounter for screening for malignant neoplasm of cervix: Secondary | ICD-10-CM

## 2023-01-17 DIAGNOSIS — N762 Acute vulvitis: Secondary | ICD-10-CM | POA: Diagnosis not present

## 2023-01-17 DIAGNOSIS — Z01419 Encounter for gynecological examination (general) (routine) without abnormal findings: Secondary | ICD-10-CM

## 2023-01-17 DIAGNOSIS — M8589 Other specified disorders of bone density and structure, multiple sites: Secondary | ICD-10-CM

## 2023-01-17 DIAGNOSIS — K080 Exfoliation of teeth due to systemic causes: Secondary | ICD-10-CM | POA: Diagnosis not present

## 2023-01-17 LAB — WET PREP FOR TRICH, YEAST, CLUE

## 2023-01-17 MED ORDER — CLOBETASOL PROPIONATE 0.05 % EX OINT
1.0000 | TOPICAL_OINTMENT | Freq: Two times a day (BID) | CUTANEOUS | 0 refills | Status: AC
Start: 2023-01-17 — End: ?

## 2023-01-17 MED ORDER — FLUCONAZOLE 150 MG PO TABS
150.0000 mg | ORAL_TABLET | ORAL | 0 refills | Status: AC
Start: 2023-01-17 — End: ?

## 2023-01-17 NOTE — Progress Notes (Signed)
[name] [birth year] [id number]   History:  67 y.o. MWF G1 P1 presents for annual exam. Complains of intermittent vulvar itching/burning. Extends to anus. No discharge or odor. Has been using vasoline with some relief. Pain with intercourse occasionally. Postmenopausal - no HRT, no bleeding.  Normal Pap and mammogram history.  History of osteopenia, vitamin D deficiency, and GERD, managed by PCP.    Gynecologic History No LMP recorded. Patient is postmenopausal.   Contraception/Family planning: post menopausal status Sexually active: Yes  Health Maintenance Last Pap: 10/08/2018. Results were: Normal neg HPV, 5-year repeat Last mammogram: 08/19/2022. Results were: Normal Colonoscopy: 11/07/2017. Results: Benign polyp, 5-year recall Last DXA: 03/31/2020. Results: T-score-1.8, FRAX 9.4% / 1.1%  Past medical history, past surgical history, family history and social history were all reviewed and documented in the EPIC chart.  Married. Retired. Daughter in emergency medicine, lives in [place]. 2 yo granddaughter. Mother diagnosed with colon cancer at age 34.  ROS:  A ROS was performed and pertinent positives and negatives are included.  Exam:  Vitals:   01/17/23 0942  BP: 122/68  Pulse: 72  SpO2: 100%  Weight: 150 lb (68 kg)  Height: 5' 4.96" (1.65 m)     Body mass index is 24.99 kg/m.  General appearance:  Normal Thyroid:  Symmetrical, normal in size, without palpable masses or nodularity. Respiratory  Auscultation:  Clear without wheezing or rhonchi Cardiovascular  Auscultation:  Regular rate, without rubs, murmurs or gallops  Edema/varicosities:  Not grossly evident Abdominal  Soft,nontender, without masses, guarding or rebound.  Liver/spleen:  No organomegaly noted  Hernia:  None appreciated  Skin  Inspection:  Grossly normal   Breasts: Examined lying and sitting.   Right: Without masses, retractions, discharge or axillary adenopathy.   Left: Without masses,  retractions, discharge or axillary adenopathy. Genitourinary   Inguinal/mons:  Normal without inguinal adenopathy  External genitalia:  Normal appearing vulva with no masses, tenderness, or lesions  BUS/Urethra/Skene's glands:  Normal  Vagina:  Normal appearing with normal color and discharge, no lesions  Cervix:  Normal appearing without discharge or lesions. Mild atrophic changes  Uterus:  Normal in size, shape and contour.  Midline and mobile, nontender  Adnexa/parametria:     Rt: Normal in size, without masses or tenderness.   Lt: Normal in size, without masses or tenderness.  Anus and perineum: Normal  Digital rectal exam: Not indicated  Wet prep negative for pathogens  Patient informed chaperone available to be present for breast and pelvic exam. Patient has requested no chaperone to be present. Patient has been advised what will be completed during breast and pelvic exam.   Assessment/Plan:  67 y.o. G1P1 for annual exam.    Encounter for breast and pelvic examination - Education provided on SBEs, importance of preventative screenings, current guidelines, high calcium diet, vitamin D supplement daily, and regular exercise including weight-bearing exercises. Labs with PCP.  Postmenopausal - no HRT, no bleeding.   Screening for cervical cancer - Plan: Cytology - PAP( [place]). Normal pap history.   Osteopenia of multiple sites - Plan: DG Bone Density. Continue daily Vitamin D supplement and high calcium diet. She cannot tolerate calcium supplements. Recommend DXA. Recall placed.   Acute vulvitis - Plan: WET PREP FOR TRICH, YEAST, CLUE, clobetasol ointment (TEMOVATE) 0.05 %, fluconazole (DIFLUCAN) 150 MG tablet today and repeat in 3 days for total of 2 doses. If symptoms persist use Clobetasol BID x 7 days. Briefly discussed possible atrophic vaginitis.   Screening  for breast cancer - Normal mammogram history.  Continue annual screenings.  Normal breast exam today.  Screening  for colon cancer - 2019 colonoscopy. Due now and plans ot schedule soon.  Follow up in 2 years for breast and pelvic exam or sooner if needed.        [facility], 9:51 AM 01/17/2023

## 2023-01-18 ENCOUNTER — Ambulatory Visit
Admission: RE | Admit: 2023-01-18 | Discharge: 2023-01-18 | Disposition: A | Discharge status: Home / Self Care | Payer: Medicare Other | Source: Ambulatory Visit | Attending: Sports Medicine | Admitting: Sports Medicine

## 2023-01-18 ENCOUNTER — Other Ambulatory Visit: Payer: Self-pay | Admitting: Sports Medicine

## 2023-01-18 DIAGNOSIS — M25551 Pain in right hip: Secondary | ICD-10-CM | POA: Diagnosis not present

## 2023-01-18 LAB — CYTOLOGY - PAP
Comment: NEGATIVE
Diagnosis: NEGATIVE
High risk HPV: NEGATIVE

## 2023-01-24 ENCOUNTER — Encounter: Payer: Self-pay | Admitting: Internal Medicine

## 2023-01-26 ENCOUNTER — Other Ambulatory Visit: Payer: Self-pay | Admitting: Internal Medicine

## 2023-01-26 MED ORDER — AZITHROMYCIN 250 MG PO TABS: ORAL_TABLET | ORAL | 0 refills | Status: DC

## 2023-01-26 MED ORDER — ONDANSETRON HCL 4 MG PO TABS: 4.0000 mg | ORAL_TABLET | Freq: Three times a day (TID) | ORAL | 0 refills | Status: AC | PRN

## 2023-01-26 MED ORDER — DIPHENOXYLATE-ATROPINE 2.5-0.025 MG PO TABS: 1.0000 | ORAL_TABLET | Freq: Four times a day (QID) | ORAL | 0 refills | Status: AC | PRN

## 2023-02-01 DIAGNOSIS — T148XXA Other injury of unspecified body region, initial encounter: Secondary | ICD-10-CM | POA: Diagnosis not present

## 2023-02-02 DIAGNOSIS — M25551 Pain in right hip: Secondary | ICD-10-CM | POA: Diagnosis not present

## 2023-02-03 ENCOUNTER — Telehealth: Payer: Self-pay

## 2023-02-03 NOTE — Telephone Encounter (Signed)
Pharmacy Patient Advocate Encounter   Received notification from CoverMyMeds that prior authorization for Diphenoxylate-Atropine 2.5-0.025MG  tablets is required/requested.   Insurance verification completed.   The patient is insured through [facility] .   Per test claim: PA required; PA submitted to [employer] via CoverMyMeds Key/confirmation #/EOC [id number] Status is pending

## 2023-02-03 NOTE — Telephone Encounter (Signed)
[employer] representative called and said the prior authorization was approved from 02/03/2023-02/03/2024. Best callback for them is [phone], option 5.

## 2023-02-22 DIAGNOSIS — M25842 Other specified joint disorders, left hand: Secondary | ICD-10-CM | POA: Diagnosis not present

## 2023-02-22 DIAGNOSIS — M79642 Pain in left hand: Secondary | ICD-10-CM | POA: Diagnosis not present

## 2023-03-27 DIAGNOSIS — L820 Inflamed seborrheic keratosis: Secondary | ICD-10-CM | POA: Diagnosis not present

## 2023-03-27 DIAGNOSIS — L538 Other specified erythematous conditions: Secondary | ICD-10-CM | POA: Diagnosis not present

## 2023-03-27 DIAGNOSIS — Z789 Other specified health status: Secondary | ICD-10-CM | POA: Diagnosis not present

## 2023-03-27 DIAGNOSIS — L814 Other melanin hyperpigmentation: Secondary | ICD-10-CM | POA: Diagnosis not present

## 2023-05-30 DIAGNOSIS — M9901 Segmental and somatic dysfunction of cervical region: Secondary | ICD-10-CM | POA: Diagnosis not present

## 2023-05-30 DIAGNOSIS — M5410 Radiculopathy, site unspecified: Secondary | ICD-10-CM | POA: Diagnosis not present

## 2023-05-30 DIAGNOSIS — R531 Weakness: Secondary | ICD-10-CM | POA: Diagnosis not present

## 2023-05-30 DIAGNOSIS — M25512 Pain in left shoulder: Secondary | ICD-10-CM | POA: Diagnosis not present

## 2023-05-30 DIAGNOSIS — M62838 Other muscle spasm: Secondary | ICD-10-CM | POA: Diagnosis not present

## 2023-05-30 DIAGNOSIS — R29898 Other symptoms and signs involving the musculoskeletal system: Secondary | ICD-10-CM | POA: Diagnosis not present

## 2023-06-12 ENCOUNTER — Ambulatory Visit: Payer: Medicare Other | Admitting: Podiatry

## 2023-06-20 ENCOUNTER — Ambulatory Visit (INDEPENDENT_AMBULATORY_CARE_PROVIDER_SITE_OTHER): Payer: Medicare Other | Admitting: Podiatry

## 2023-06-20 DIAGNOSIS — Z91199 Patient's noncompliance with other medical treatment and regimen due to unspecified reason: Secondary | ICD-10-CM

## 2023-06-20 NOTE — Progress Notes (Signed)
No show

## 2023-06-23 ENCOUNTER — Other Ambulatory Visit: Payer: Self-pay | Admitting: Sports Medicine

## 2023-06-23 DIAGNOSIS — M546 Pain in thoracic spine: Secondary | ICD-10-CM | POA: Diagnosis not present

## 2023-06-23 DIAGNOSIS — M5412 Radiculopathy, cervical region: Secondary | ICD-10-CM

## 2023-06-23 DIAGNOSIS — M25512 Pain in left shoulder: Secondary | ICD-10-CM | POA: Diagnosis not present

## 2023-06-23 DIAGNOSIS — M5410 Radiculopathy, site unspecified: Secondary | ICD-10-CM | POA: Diagnosis not present

## 2023-06-23 DIAGNOSIS — M542 Cervicalgia: Secondary | ICD-10-CM | POA: Diagnosis not present

## 2023-06-26 ENCOUNTER — Other Ambulatory Visit: Payer: Self-pay | Admitting: Sports Medicine

## 2023-06-26 ENCOUNTER — Ambulatory Visit
Admission: RE | Admit: 2023-06-26 | Discharge: 2023-06-26 | Disposition: A | Discharge status: Home / Self Care | Payer: Medicare Other | Source: Ambulatory Visit | Attending: Sports Medicine | Admitting: Sports Medicine

## 2023-06-26 DIAGNOSIS — M542 Cervicalgia: Secondary | ICD-10-CM | POA: Diagnosis not present

## 2023-06-26 DIAGNOSIS — M5412 Radiculopathy, cervical region: Secondary | ICD-10-CM

## 2023-06-26 DIAGNOSIS — M546 Pain in thoracic spine: Secondary | ICD-10-CM

## 2023-06-26 DIAGNOSIS — M79602 Pain in left arm: Secondary | ICD-10-CM | POA: Diagnosis not present

## 2023-07-04 ENCOUNTER — Encounter: Payer: Self-pay | Admitting: Internal Medicine

## 2023-07-04 ENCOUNTER — Ambulatory Visit: Payer: Medicare Other | Admitting: Internal Medicine

## 2023-07-04 VITALS — BP 112/68 | HR 65 | Temp 98.6°F | Ht 64.0 in | Wt 150.0 lb

## 2023-07-04 DIAGNOSIS — E538 Deficiency of other specified B group vitamins: Secondary | ICD-10-CM | POA: Diagnosis not present

## 2023-07-04 DIAGNOSIS — R202 Paresthesia of skin: Secondary | ICD-10-CM

## 2023-07-04 DIAGNOSIS — M5412 Radiculopathy, cervical region: Secondary | ICD-10-CM | POA: Diagnosis not present

## 2023-07-04 DIAGNOSIS — R748 Abnormal levels of other serum enzymes: Secondary | ICD-10-CM

## 2023-07-04 DIAGNOSIS — E559 Vitamin D deficiency, unspecified: Secondary | ICD-10-CM | POA: Diagnosis not present

## 2023-07-04 DIAGNOSIS — E785 Hyperlipidemia, unspecified: Secondary | ICD-10-CM

## 2023-07-04 LAB — COMPREHENSIVE METABOLIC PANEL
ALT: 94 U/L — ABNORMAL HIGH (ref 0–35)
AST: 63 U/L — ABNORMAL HIGH (ref 0–37)
Albumin: 4.3 g/dL (ref 3.5–5.2)
Alkaline Phosphatase: 67 U/L (ref 39–117)
BUN: 13 mg/dL (ref 6–23)
CO2: 30 meq/L (ref 19–32)
Calcium: 9.4 mg/dL (ref 8.4–10.5)
Chloride: 102 meq/L (ref 96–112)
Creatinine, Ser: 0.73 mg/dL (ref 0.40–1.20)
GFR: 86.01 mL/min (ref >60.00)
Glucose, Bld: 93 mg/dL (ref 70–99)
Potassium: 4.1 meq/L (ref 3.5–5.1)
Sodium: 140 meq/L (ref 135–145)
Total Bilirubin: 0.5 mg/dL (ref 0.2–1.2)
Total Protein: 7.1 g/dL (ref 6.0–8.3)

## 2023-07-04 LAB — TSH: TSH: 2.89 u[IU]/mL (ref 0.35–5.50)

## 2023-07-04 LAB — VITAMIN D 25 HYDROXY (VIT D DEFICIENCY, FRACTURES): VITD: 25.18 ng/mL — ABNORMAL LOW (ref 30.00–100.00)

## 2023-07-04 LAB — VITAMIN B12: Vitamin B-12: 313 pg/mL (ref 211–911)

## 2023-07-04 LAB — T4, FREE: Free T4: 0.93 ng/dL (ref 0.60–1.60)

## 2023-07-04 MED ORDER — PREDNISONE 10 MG PO TABS: ORAL_TABLET | ORAL | 1 refills | Status: AC

## 2023-07-04 MED ORDER — CYCLOBENZAPRINE HCL 5 MG PO TABS: 5.0000 mg | ORAL_TABLET | Freq: Three times a day (TID) | ORAL | 1 refills | Status: AC | PRN

## 2023-07-04 MED ORDER — HYDROCODONE-ACETAMINOPHEN 5-325 MG PO TABS: 1.0000 | ORAL_TABLET | ORAL | 0 refills | Status: AC | PRN

## 2023-07-04 NOTE — Patient Instructions (Signed)

## 2023-07-04 NOTE — Assessment & Plan Note (Signed)
Re-start B12 

## 2023-07-04 NOTE — Progress Notes (Signed)
 Subjective:  Patient ID: [name], female    DOB: [birth year]  Age: 68 y.o. MRN: [id number]  CC: No chief complaint on file.   HPI [name] presents for L shoulder and neck pain >1 month up to 10/10 Pt took prednisone  - it helped; L hand tingling, musles are twitching Pt saw Dr Marquette  Outpatient Medications Prior to Visit  Medication Sig Dispense Refill  . AUVI-Q  0.3 MG/0.3ML SOAJ injection Inject 0.3 mg into the muscle as needed for anaphylaxis. Use as directed for life-threatening allergic reaction. 2 each 1  . cetirizine  (ZYRTEC ) 10 MG tablet Take 1 tablet (10 mg total) by mouth daily as needed for allergies (Can take na extra dose during flare ups.). 180 tablet 3  . Cholecalciferol  (EQL VITAMIN D3) 1000 units tablet Take 2 tablets (2,000 Units total) by mouth daily. 2 tabs po daily 100 tablet 3  . clobetasol  ointment (TEMOVATE ) 0.05 % Apply 1 Application topically 2 (two) times daily. 30 g 0  . diphenoxylate -atropine  (LOMOTIL ) 2.5-0.025 MG tablet Take 1 tablet by mouth 4 (four) times daily as needed for diarrhea or loose stools. 60 tablet 0  . fluconazole  (DIFLUCAN ) 150 MG tablet Take 1 tablet (150 mg total) by mouth every 3 (three) days. 2 tablet 0  . fluticasone  (FLONASE ) 50 MCG/ACT nasal spray Place 2 sprays into both nostrils daily. 16 g 0  . gabapentin (NEURONTIN) 300 MG capsule Take 300 mg by mouth 3 (three) times daily.    . ibuprofen  (ADVIL ) 800 MG tablet Take 1 tab twice daily with food for 7-10 days. Then take as needed. 40 tablet 0  . levocetirizine (XYZAL ) 5 MG tablet Take 1 tablet (5 mg total) by mouth every evening. 15 tablet 0  . ondansetron  (ZOFRAN ) 4 MG tablet Take 1 tablet (4 mg total) by mouth every 8 (eight) hours as needed for nausea or vomiting. 20 tablet 0  . azithromycin  (ZITHROMAX  Z-PAK) 250 MG tablet As directed (Patient not taking: Reported on 07/04/2023) 6 tablet 0   No facility-administered medications prior to visit.    ROS: Review of Systems   Constitutional:  Negative for activity change, appetite change, chills, fatigue and unexpected weight change.  HENT:  Negative for congestion, mouth sores and sinus pressure.   Eyes:  Negative for visual disturbance.  Respiratory:  Negative for cough and chest tightness.   Gastrointestinal:  Negative for abdominal pain and nausea.  Genitourinary:  Negative for difficulty urinating, frequency and vaginal pain.  Musculoskeletal:  Positive for neck pain and neck stiffness. Negative for back pain and gait problem.  Skin:  Negative for pallor and rash.  Neurological:  Positive for weakness and numbness. Negative for dizziness, tremors and headaches.  Psychiatric/Behavioral:  Negative for confusion and sleep disturbance.     Objective:  BP 112/68 (BP Location: Left Arm, Patient Position: Sitting, Cuff Size: Normal)   Pulse 65   Temp 98.6 F (37 C) (Oral)   Ht 5' 4 (1.626 m)   Wt 150 lb (68 kg)   SpO2 98%   BMI 25.75 kg/m   BP Readings from Last 3 Encounters:  07/04/23 112/68  01/17/23 122/68  08/02/22 118/78    Wt Readings from Last 3 Encounters:  07/04/23 150 lb (68 kg)  01/17/23 150 lb (68 kg)  08/02/22 150 lb 12.8 oz (68.4 kg)    Physical Exam Constitutional:      General: She is not in acute distress.    Appearance: Normal appearance. She is well-developed.  HENT:     Head: Normocephalic.     Right Ear: External ear normal.     Left Ear: External ear normal.     Nose: Nose normal.  Eyes:     General:        Right eye: No discharge.        Left eye: No discharge.     Conjunctiva/sclera: Conjunctivae normal.     Pupils: Pupils are equal, round, and reactive to light.  Neck:     Thyroid : No thyromegaly.     Vascular: No JVD.     Trachea: No tracheal deviation.  Cardiovascular:     Rate and Rhythm: Normal rate and regular rhythm.     Heart sounds: Normal heart sounds.  Pulmonary:     Effort: No respiratory distress.     Breath sounds: No stridor. No wheezing.   Abdominal:     General: Bowel sounds are normal. There is no distension.     Palpations: Abdomen is soft. There is no mass.     Tenderness: There is no abdominal tenderness. There is no guarding or rebound.  Musculoskeletal:        General: No tenderness.     Cervical back: Normal range of motion and neck supple. No rigidity.  Lymphadenopathy:     Cervical: No cervical adenopathy.  Skin:    Findings: No erythema or rash.  Neurological:     Cranial Nerves: No cranial nerve deficit.     Motor: No abnormal muscle tone.     Coordination: Coordination normal.     Deep Tendon Reflexes: Reflexes normal.  Psychiatric:        Behavior: Behavior normal.        Thought Content: Thought content normal.        Judgment: Judgment normal.    Lab Results  Component Value Date   WBC 6.6 11/24/2022   HGB 13.8 11/24/2022   HCT 41.8 11/24/2022   PLT 207.0 11/24/2022   GLUCOSE 92 11/24/2022   CHOL 227 (H) 11/24/2022   TRIG 75.0 11/24/2022   HDL 66.00 11/24/2022   LDLCALC 146 (H) 11/24/2022   ALT 22 11/24/2022   AST 19 11/24/2022   NA 143 11/24/2022   K 4.1 11/24/2022   CL 104 11/24/2022   CREATININE 0.74 11/24/2022   BUN 12 11/24/2022   CO2 30 11/24/2022   TSH 3.33 11/24/2022    DG Thoracic Spine W/Swimmers Result Date: 07/01/2023 CLINICAL DATA:  Chronic neck and left arm pain. EXAM: THORACIC SPINE - 3 VIEWS COMPARISON:  Cervical spine radiographs-earlier same day FINDINGS: Evaluation of the superior aspect of the thoracic spine as well as the cervicothoracic junction, is degraded secondary to overlying osseous soft tissue structures Normal alignment of the thoracic spine. No significant scoliotic curvature. No anterolisthesis or retrolisthesis. Thoracic vertebral body heights appear preserved. Thoracic intervertebral disc space heights appear preserved Limited visualization of the adjacent thorax is normal given technique. Regional soft tissues appear normal. IMPRESSION: No explanation for  patient's chronic left neck and arm pain. Electronically Signed   By: Norleen Roulette M.D.   On: 07/01/2023 18:15   DG Cervical Spine Complete Result Date: 07/01/2023 CLINICAL DATA:  Chronic neck and left arm pain. EXAM: CERVICAL SPINE - COMPLETE 4+ VIEW COMPARISON:  08/13/2013; thoracic spine radiographs-earlier same day FINDINGS: C1 to the superior endplate of T1 is imaged on the provided lateral radiograph. Normal alignment of the cervical spine. No anterolisthesis or retrolisthesis. The bilateral facets appear normally aligned.  The dens appears normally positioned between the lateral masses of C1 Cervical vertebral body heights are preserved. Prevertebral soft tissues are normal Mild to moderate multilevel cervical spine DDD, worse at C5-C6 with disc space height loss, endplate irregularity and sclerosis, slightly progressed compared to remote examination performed in 2015. The bilateral neural foramina appear patent given obliquity Regional soft tissues appear normal Limited visualization of the lung apices is normal given obliquity and technique. IMPRESSION: Mild to moderate multilevel cervical spine DDD, worse at C5-C6, slightly progressed compared to remote examination performed in 2015. Electronically Signed   By: Norleen Roulette M.D.   On: 07/01/2023 18:14    Assessment & Plan:   Problem List Items Addressed This Visit   None Visit Diagnoses       Cervical radiculitis    -  Primary   Relevant Medications   gabapentin (NEURONTIN) 300 MG capsule   cyclobenzaprine  (FLEXERIL ) 5 MG tablet   Other Relevant Orders   MR Cervical Spine Wo Contrast         Meds ordered this encounter  Medications  . predniSONE  (DELTASONE ) 10 MG tablet    Sig: Prednisone  10 mg: take 4 tabs a day x 3 days; then 3 tabs a day x 4 days; then 2 tabs a day x 4 days, then 1 tab a day x 6 days, then stop. Take pc.    Dispense:  38 tablet    Refill:  1  . HYDROcodone -acetaminophen  (NORCO/VICODIN) 5-325 MG tablet    Sig:  Take 1 tablet by mouth every 4 (four) hours as needed for severe pain (pain score 7-10).    Dispense:  20 tablet    Refill:  0  . cyclobenzaprine  (FLEXERIL ) 5 MG tablet    Sig: Take 1 tablet (5 mg total) by mouth 3 (three) times daily as needed for muscle spasms.    Dispense:  30 tablet    Refill:  1      Follow-up: Return in about 6 weeks (around 08/15/2023) for a follow-up visit.  Marolyn Noel, MD

## 2023-07-05 ENCOUNTER — Other Ambulatory Visit: Payer: Medicare Other

## 2023-07-06 DIAGNOSIS — L821 Other seborrheic keratosis: Secondary | ICD-10-CM | POA: Diagnosis not present

## 2023-07-07 ENCOUNTER — Other Ambulatory Visit: Payer: Medicare Other

## 2023-07-07 ENCOUNTER — Ambulatory Visit
Admission: RE | Admit: 2023-07-07 | Discharge: 2023-07-07 | Disposition: A | Discharge status: Home / Self Care | Payer: Medicare Other | Source: Ambulatory Visit | Attending: Internal Medicine | Admitting: Internal Medicine

## 2023-07-07 DIAGNOSIS — M5124 Other intervertebral disc displacement, thoracic region: Secondary | ICD-10-CM | POA: Diagnosis not present

## 2023-07-07 DIAGNOSIS — M5412 Radiculopathy, cervical region: Secondary | ICD-10-CM

## 2023-07-07 DIAGNOSIS — M4802 Spinal stenosis, cervical region: Secondary | ICD-10-CM | POA: Diagnosis not present

## 2023-07-07 DIAGNOSIS — M47812 Spondylosis without myelopathy or radiculopathy, cervical region: Secondary | ICD-10-CM | POA: Diagnosis not present

## 2023-07-07 DIAGNOSIS — R945 Abnormal results of liver function studies: Secondary | ICD-10-CM | POA: Diagnosis not present

## 2023-07-07 DIAGNOSIS — R748 Abnormal levels of other serum enzymes: Secondary | ICD-10-CM

## 2023-07-10 ENCOUNTER — Encounter: Payer: Self-pay | Admitting: Internal Medicine

## 2023-07-14 DIAGNOSIS — M9908 Segmental and somatic dysfunction of rib cage: Secondary | ICD-10-CM | POA: Diagnosis not present

## 2023-07-14 DIAGNOSIS — M9901 Segmental and somatic dysfunction of cervical region: Secondary | ICD-10-CM | POA: Diagnosis not present

## 2023-07-14 DIAGNOSIS — M9902 Segmental and somatic dysfunction of thoracic region: Secondary | ICD-10-CM | POA: Diagnosis not present

## 2023-07-14 DIAGNOSIS — M62838 Other muscle spasm: Secondary | ICD-10-CM | POA: Diagnosis not present

## 2023-07-14 DIAGNOSIS — M5410 Radiculopathy, site unspecified: Secondary | ICD-10-CM | POA: Diagnosis not present

## 2023-07-14 DIAGNOSIS — M542 Cervicalgia: Secondary | ICD-10-CM | POA: Diagnosis not present

## 2023-07-14 DIAGNOSIS — M25512 Pain in left shoulder: Secondary | ICD-10-CM | POA: Diagnosis not present

## 2023-07-21 DIAGNOSIS — M9908 Segmental and somatic dysfunction of rib cage: Secondary | ICD-10-CM | POA: Diagnosis not present

## 2023-07-21 DIAGNOSIS — M9902 Segmental and somatic dysfunction of thoracic region: Secondary | ICD-10-CM | POA: Diagnosis not present

## 2023-07-21 DIAGNOSIS — M47892 Other spondylosis, cervical region: Secondary | ICD-10-CM | POA: Diagnosis not present

## 2023-07-21 DIAGNOSIS — M4802 Spinal stenosis, cervical region: Secondary | ICD-10-CM | POA: Diagnosis not present

## 2023-07-21 DIAGNOSIS — M9901 Segmental and somatic dysfunction of cervical region: Secondary | ICD-10-CM | POA: Diagnosis not present

## 2023-07-21 DIAGNOSIS — M9907 Segmental and somatic dysfunction of upper extremity: Secondary | ICD-10-CM | POA: Diagnosis not present

## 2023-07-21 DIAGNOSIS — M62838 Other muscle spasm: Secondary | ICD-10-CM | POA: Diagnosis not present

## 2023-07-21 DIAGNOSIS — M542 Cervicalgia: Secondary | ICD-10-CM | POA: Diagnosis not present

## 2023-07-22 ENCOUNTER — Encounter: Payer: Self-pay | Admitting: Internal Medicine

## 2023-07-25 ENCOUNTER — Other Ambulatory Visit: Payer: Self-pay | Admitting: Internal Medicine

## 2023-07-25 DIAGNOSIS — M5412 Radiculopathy, cervical region: Secondary | ICD-10-CM

## 2023-07-25 DIAGNOSIS — K080 Exfoliation of teeth due to systemic causes: Secondary | ICD-10-CM | POA: Diagnosis not present

## 2023-09-07 DIAGNOSIS — H5213 Myopia, bilateral: Secondary | ICD-10-CM | POA: Diagnosis not present

## 2023-09-13 DIAGNOSIS — Z6826 Body mass index (BMI) 26.0-26.9, adult: Secondary | ICD-10-CM | POA: Diagnosis not present

## 2023-09-13 DIAGNOSIS — M5412 Radiculopathy, cervical region: Secondary | ICD-10-CM | POA: Diagnosis not present

## 2023-10-02 ENCOUNTER — Other Ambulatory Visit: Payer: Self-pay | Admitting: Nurse Practitioner

## 2023-10-02 DIAGNOSIS — Z1231 Encounter for screening mammogram for malignant neoplasm of breast: Secondary | ICD-10-CM

## 2023-10-18 ENCOUNTER — Ambulatory Visit

## 2023-10-24 DIAGNOSIS — M791 Myalgia, unspecified site: Secondary | ICD-10-CM | POA: Diagnosis not present

## 2023-10-24 DIAGNOSIS — M9908 Segmental and somatic dysfunction of rib cage: Secondary | ICD-10-CM | POA: Diagnosis not present

## 2023-10-24 DIAGNOSIS — M25521 Pain in right elbow: Secondary | ICD-10-CM | POA: Diagnosis not present

## 2023-10-24 DIAGNOSIS — M25531 Pain in right wrist: Secondary | ICD-10-CM | POA: Diagnosis not present

## 2023-10-24 DIAGNOSIS — M79621 Pain in right upper arm: Secondary | ICD-10-CM | POA: Diagnosis not present

## 2023-10-24 DIAGNOSIS — M9901 Segmental and somatic dysfunction of cervical region: Secondary | ICD-10-CM | POA: Diagnosis not present

## 2023-10-24 DIAGNOSIS — M9907 Segmental and somatic dysfunction of upper extremity: Secondary | ICD-10-CM | POA: Diagnosis not present

## 2023-10-25 ENCOUNTER — Ambulatory Visit

## 2023-11-02 ENCOUNTER — Ambulatory Visit
Admission: RE | Admit: 2023-11-02 | Discharge: 2023-11-02 | Disposition: A | Discharge status: Home / Self Care | Source: Ambulatory Visit | Attending: Nurse Practitioner | Admitting: Nurse Practitioner

## 2023-11-02 ENCOUNTER — Ambulatory Visit

## 2023-11-02 DIAGNOSIS — Z1231 Encounter for screening mammogram for malignant neoplasm of breast: Secondary | ICD-10-CM

## 2023-11-13 ENCOUNTER — Encounter: Payer: Self-pay | Admitting: Internal Medicine

## 2023-11-13 ENCOUNTER — Ambulatory Visit: Payer: Self-pay

## 2023-11-13 ENCOUNTER — Ambulatory Visit (INDEPENDENT_AMBULATORY_CARE_PROVIDER_SITE_OTHER): Admitting: Internal Medicine

## 2023-11-13 VITALS — BP 114/80 | HR 66 | Temp 98.5°F | Ht 64.0 in | Wt 153.0 lb

## 2023-11-13 DIAGNOSIS — E559 Vitamin D deficiency, unspecified: Secondary | ICD-10-CM

## 2023-11-13 DIAGNOSIS — S90462A Insect bite (nonvenomous), left great toe, initial encounter: Secondary | ICD-10-CM | POA: Diagnosis not present

## 2023-11-13 DIAGNOSIS — W57XXXA Bitten or stung by nonvenomous insect and other nonvenomous arthropods, initial encounter: Secondary | ICD-10-CM | POA: Diagnosis not present

## 2023-11-13 DIAGNOSIS — E538 Deficiency of other specified B group vitamins: Secondary | ICD-10-CM | POA: Diagnosis not present

## 2023-11-13 NOTE — Progress Notes (Unsigned)
 Patient ID: [name], female   DOB: October 28, 1955, 68 y.o.   MRN: [id number]        Chief Complaint: follow up tick bite, low vitamins       HPI:  [name] is a 68 y.o. female here with c/o tick in place to left great toe, hoping for us  to remove as she had a difficult experience previously with husband removing a tick but the head remained in place several years ago.  No rash, fever.  Pt denies chest pain, increased sob or doe, wheezing, orthopnea, PND, increased LE swelling, palpitations, dizziness or syncope.        Wt Readings from Last 3 Encounters:  11/13/23 153 lb (69.4 kg)  07/04/23 150 lb (68 kg)  01/17/23 150 lb (68 kg)   BP Readings from Last 3 Encounters:  11/13/23 114/80  07/04/23 112/68  01/17/23 122/68         Past Medical History:  Diagnosis Date   Allergy    GI upset from allergies to fish etc   Anemia    long ago- past hx    Cataract    mild   GERD (gastroesophageal reflux disease)    Hereditary angioedema (HCC)    high IgE levels   Hives    history of- cold exposure related   Hx of small bowel obstruction    due to hereditary andioedema- triggered by salmon    Osteopenia 07/2017   T score -1.9 FRAX 9% / 1.1%   Past Surgical History:  Procedure Laterality Date   COLONOSCOPY  2009   DB    EXPLORATORY LAPAROTOMY     UPPER GASTROINTESTINAL ENDOSCOPY      reports that she has never smoked. She has never used smokeless tobacco. She reports current alcohol use. She reports that she does not use drugs. family history includes Allergy (severe) in her daughter and father; Angioedema in her daughter; Breast cancer in her paternal aunt; Colon cancer (age of onset: 4) in her mother; Macular degeneration in her mother; Parkinsonism in her mother; Rheum arthritis in her father; Urticaria in her daughter. Allergies  Allergen Reactions   Penicillins Anaphylaxis    REACTION: Anyphylactic shock   Current Outpatient Medications on File Prior to Visit   Medication Sig Dispense Refill   AUVI-Q  0.3 MG/0.3ML SOAJ injection Inject 0.3 mg into the muscle as needed for anaphylaxis. Use as directed for life-threatening allergic reaction. 2 each 1   cetirizine  (ZYRTEC ) 10 MG tablet Take 1 tablet (10 mg total) by mouth daily as needed for allergies (Can take na extra dose during flare ups.). 180 tablet 3   Cholecalciferol  (EQL VITAMIN D3) 1000 units tablet Take 2 tablets (2,000 Units total) by mouth daily. 2 tabs po daily 100 tablet 3   clobetasol  ointment (TEMOVATE ) 0.05 % Apply 1 Application topically 2 (two) times daily. 30 g 0   cyclobenzaprine  (FLEXERIL ) 5 MG tablet Take 1 tablet (5 mg total) by mouth 3 (three) times daily as needed for muscle spasms. 30 tablet 1   diphenoxylate -atropine  (LOMOTIL ) 2.5-0.025 MG tablet Take 1 tablet by mouth 4 (four) times daily as needed for diarrhea or loose stools. 60 tablet 0   fluconazole  (DIFLUCAN ) 150 MG tablet Take 1 tablet (150 mg total) by mouth every 3 (three) days. 2 tablet 0   fluticasone  (FLONASE ) 50 MCG/ACT nasal spray Place 2 sprays into both nostrils daily. 16 g 0   gabapentin (NEURONTIN) 300 MG capsule Take 300 mg by mouth 3 (  three) times daily.     HYDROcodone -acetaminophen  (NORCO/VICODIN) 5-325 MG tablet Take 1 tablet by mouth every 4 (four) hours as needed for severe pain (pain score 7-10). 20 tablet 0   levocetirizine (XYZAL ) 5 MG tablet Take 1 tablet (5 mg total) by mouth every evening. 15 tablet 0   ondansetron  (ZOFRAN ) 4 MG tablet Take 1 tablet (4 mg total) by mouth every 8 (eight) hours as needed for nausea or vomiting. 20 tablet 0   predniSONE  (DELTASONE ) 10 MG tablet Prednisone  10 mg: take 4 tabs a day x 3 days; then 3 tabs a day x 4 days; then 2 tabs a day x 4 days, then 1 tab a day x 6 days, then stop. Take pc. 38 tablet 1   No current facility-administered medications on file prior to visit.        ROS:  All others reviewed and negative.  Objective        PE:  BP 114/80 (BP Location:  Left Arm, Patient Position: Sitting, Cuff Size: Normal)   Pulse 66   Temp 98.5 F (36.9 C) (Oral)   Ht 5' 4 (1.626 m)   Wt 153 lb (69.4 kg)   SpO2 99%   BMI 26.26 kg/m                 Constitutional: Pt appears in NAD               HENT: Head: NCAT.                Right Ear: External ear normal.                 Left Ear: External ear normal.                Eyes: . Pupils are equal, round, and reactive to light. Conjunctivae and EOM are normal               Nose: without d/c or deformity               Neck: Neck supple. Gross normal ROM               Cardiovascular: Normal rate and regular rhythm.                 Pulmonary/Chest: Effort normal and breath sounds without rales or wheezing.                               Neurological: Pt is alert. At baseline orientation, motor grossly intact               Skin: Skin is warm. LE edema - none, left great toe with tick removed with minor redness at bite site apparent, no rash               Psychiatric: Pt behavior is normal without agitation   Micro: none  Cardiac tracings I have personally interpreted today:  none  Pertinent Radiological findings (summarize): none   Lab Results  Component Value Date   WBC 6.6 11/24/2022   HGB 13.8 11/24/2022   HCT 41.8 11/24/2022   PLT 207.0 11/24/2022   GLUCOSE 93 07/04/2023   CHOL 227 (H) 11/24/2022   TRIG 75.0 11/24/2022   HDL 66.00 11/24/2022   LDLCALC 146 (H) 11/24/2022   ALT 94 (H) 07/04/2023   AST 63 (H) 07/04/2023   NA 140 07/04/2023   K  4.1 07/04/2023   CL 102 07/04/2023   CREATININE 0.73 07/04/2023   BUN 13 07/04/2023   CO2 30 07/04/2023   TSH 2.89 07/04/2023   Assessment/Plan:  [name] is a 68 y.o. White or Caucasian [1] female with  has a past medical history of Allergy, Anemia, Cataract, GERD (gastroesophageal reflux disease), Hereditary angioedema (HCC), Hives, small bowel obstruction, and Osteopenia (07/2017).  Tick bite of left great toe Tick removed with  tweezers intact, no evidence for infection currently but will check tickborne diseaase panel.     Vitamin D  deficiency Last vitamin D  Lab Results  Component Value Date   VD25OH 25.18 (L) 07/04/2023   Low, to start oral replacement   B12 deficiency Lab Results  Component Value Date   VITAMINB12 313 07/04/2023   Stable, cont oral replacement - b12 1000 mcg qd  Followup: Return if symptoms worsen or fail to improve.  Rosalia Colonel, MD 11/14/2023 6:50 PM [facility]

## 2023-11-13 NOTE — Telephone Encounter (Signed)
  FYI Only or Action Required?: FYI only for provider  Patient was last seen in primary care on 07/04/2023 by Plotnikov, Oakley Bellman, MD. Called Nurse Triage reporting Tick Removal. Symptoms began today. Interventions attempted: Other: Alcohol, burning the tick with fire . Symptoms are: unchanged.  Triage Disposition: See HCP Within 4 Hours (Or PCP Triage)  Patient/caregiver understands and will follow disposition?: Yes              Copied from CRM [id number]. Topic: Clinical - Red Word Triage >> Nov 13, 2023  9:38 AM Alethia Huxley E wrote: [facility] that prompted transfer to Nurse Triage: Tick bite. Patient stated the tick is still in her left foot. Reason for Disposition  Can't remove live tick (after trying Care Advice)  Answer Assessment - Initial Assessment Questions 1. ATTACHED:  Is the tick still on the skin?  (e.g., yes, no, unsure)     Yes 2. ONSET - TICK STILL ATTACHED:  How long do you think the tick has been on your skin? (e.g., hours, days, unsure)  Note:  Is there a recent activity (camping, hiking) where the caller may have been exposed?     Left foot, tick is still present  3. ONSET - TICK NOT STILL ATTACHED: If the tick has been removed, how long do you think the tick was attached before you removed it? (e.g., 5 hours, 2 days). When was this?     N/A, pt. States she noticed it in the shower this morning.  4. LOCATION: Where is the tick bite located? (e.g., arm, leg)     Left foot  5. TYPE of TICK: Is it a wood tick or a deer tick? (e.g., deer tick, wood tick; unsure)     Unknown  6. SIZE of TICK: How big is the tick? (e.g., size of poppy seed, apple seed, watermelon seed; unsure) Note: Deer ticks can be the size of a poppy seed (nymph) or an apple seed (adult).       Apple seed  7. ENGORGED: Did the tick look flat or engorged (full, swollen)? (e.g., flat, engorged; unsure)     Flat  8. OTHER SYMPTOMS: Do you have any other symptoms? (e.g., fever, rash,  redness at bite area, red ring around bite)     No  Home care remedies tried, patient is unable to remove tick from affected area.  Protocols used: Tick Bite-A-AH

## 2023-11-13 NOTE — Patient Instructions (Signed)
 Your tick was removed today  We are not able to send the tick for analysis, but;  Please continue all other medications as before, and refills have been done if requested.  Please have the pharmacy call with any other refills you may need.  Please keep your appointments with your specialists as you may have planned  Please go to the LAB at the blood drawing area for the tests to be done  - the tick disease panel  You will be contacted by phone if any changes need to be made immediately.  Otherwise, you will receive a letter about your results with an explanation, but please check with MyChart first.

## 2023-11-14 ENCOUNTER — Encounter: Payer: Self-pay | Admitting: Internal Medicine

## 2023-11-14 ENCOUNTER — Telehealth: Payer: Self-pay

## 2023-11-14 DIAGNOSIS — S90462A Insect bite (nonvenomous), left great toe, initial encounter: Secondary | ICD-10-CM | POA: Insufficient documentation

## 2023-11-14 NOTE — Assessment & Plan Note (Signed)
 Lab Results  Component Value Date   VITAMINB12 313 07/04/2023   Stable, cont oral replacement - b12 1000 mcg qd

## 2023-11-14 NOTE — Telephone Encounter (Signed)
 Copied from CRM [id number]. Topic: Clinical - Medication Question >> Nov 14, 2023 11:51 AM Adonis Hoot wrote: Reason for CRM: Patient had a tick removed by Dr Autry Legions from her toe. She would like to know if an antibiotic could be prescribed to her in case there may be a result of lime disease or anything?  [id number] - [place], [place] - [address]  Phone: [phone] Fax: [phone]

## 2023-11-14 NOTE — Assessment & Plan Note (Signed)
 Tick removed with tweezers intact, no evidence for infection currently but will check tickborne diseaase panel.

## 2023-11-14 NOTE — Assessment & Plan Note (Signed)
 Last vitamin D  Lab Results  Component Value Date   VD25OH 25.18 (L) 07/04/2023   Low, to start oral replacement

## 2023-11-15 MED ORDER — DOXYCYCLINE HYCLATE 100 MG PO TABS: 100.0000 mg | ORAL_TABLET | Freq: Two times a day (BID) | ORAL | 0 refills | Status: DC

## 2023-11-15 NOTE — Addendum Note (Signed)
 Addended by: Roslyn Coombe on: 11/15/2023 02:23 PM   Modules accepted: Orders

## 2023-11-15 NOTE — Telephone Encounter (Signed)
 Ok I will send doxy course

## 2023-11-27 ENCOUNTER — Ambulatory Visit: Payer: Self-pay

## 2023-11-27 NOTE — Telephone Encounter (Signed)
 FYI Only or Action Required?: FYI only for provider.  Patient was last seen in primary care on 11/13/2023 by Norleen Lynwood ORN, MD. Called Nurse Triage reporting Foot Swelling. Symptoms began x 1 day. Interventions attempted: Prescription medications: Antibiotics. Symptoms are: gradually worsening.  Triage Disposition: See Physician Within 24 Hours  Patient/caregiver understands and will follow disposition?: Yes  **Appt. With PCP scheduled for 7/1**             Copied from CRM [id number]. Topic: Clinical - Red Word Triage >> Nov 27, 2023  4:54 PM Taleah C wrote: Red Word that prompted transfer to Nurse Triage: redness and swollen toe, was seen for a tick bite on 6/16 & is now worse than before. Reason for Disposition  [1] MODERATE leg swelling (e.g., swelling extends up to knees) AND [2] new-onset or worsening  Answer Assessment - Initial Assessment Questions 1. ONSET: When did the swelling start? (e.g., minutes, hours, days)     Today   2. LOCATION: What part of the leg is swollen?  Are both legs swollen or just one leg?     Big Toe Left inner foot  3. SEVERITY: How bad is the swelling? (e.g., localized; mild, moderate, severe)   - Localized: Small area of swelling localized to one leg.   - MILD pedal edema: Swelling limited to foot and ankle, pitting edema < 1/4 inch (6 mm) deep, rest and elevation eliminate most or all swelling.   - MODERATE edema: Swelling of lower leg to knee, pitting edema > 1/4 inch (6 mm) deep, rest and elevation only partially reduce swelling.   - SEVERE edema: Swelling extends above knee, facial or hand swelling present.       Mild swelling, redness his strong  4. REDNESS: Does the swelling look red or infected?     Yes, she mentions oozing around the bumps.   5. PAIN: Is the swelling painful to touch? If Yes, ask: How painful is it?   (Scale 1-10; mild, moderate or severe)     Mild   6. FEVER: Do you have a fever? If Yes, ask:  What is it, how was it measured, and when did it start?      No  7. CAUSE: What do you think is causing the leg swelling?     Tick bite a couple of weeks ago    10. OTHER SYMPTOMS: Do you have any other symptoms? (e.g., chest pain, difficulty breathing)  No   Bitten by tick and seen on 6/16, she reports an area of skin with small bumps around the area. She is on an antibiotic for this, symptoms persist.  Protocols used: Leg Swelling and Edema-A-AH

## 2023-11-28 ENCOUNTER — Ambulatory Visit (INDEPENDENT_AMBULATORY_CARE_PROVIDER_SITE_OTHER): Admitting: Internal Medicine

## 2023-11-28 ENCOUNTER — Encounter: Payer: Self-pay | Admitting: Internal Medicine

## 2023-11-28 VITALS — BP 106/62 | HR 68 | Temp 97.9°F | Ht 64.0 in | Wt 155.0 lb

## 2023-11-28 DIAGNOSIS — S90462A Insect bite (nonvenomous), left great toe, initial encounter: Secondary | ICD-10-CM

## 2023-11-28 DIAGNOSIS — W57XXXA Bitten or stung by nonvenomous insect and other nonvenomous arthropods, initial encounter: Secondary | ICD-10-CM | POA: Diagnosis not present

## 2023-11-28 DIAGNOSIS — E538 Deficiency of other specified B group vitamins: Secondary | ICD-10-CM | POA: Diagnosis not present

## 2023-11-28 DIAGNOSIS — E559 Vitamin D deficiency, unspecified: Secondary | ICD-10-CM | POA: Diagnosis not present

## 2023-11-28 DIAGNOSIS — G8929 Other chronic pain: Secondary | ICD-10-CM

## 2023-11-28 DIAGNOSIS — M545 Low back pain, unspecified: Secondary | ICD-10-CM

## 2023-11-28 DIAGNOSIS — J069 Acute upper respiratory infection, unspecified: Secondary | ICD-10-CM

## 2023-11-28 MED ORDER — DOXYCYCLINE HYCLATE 100 MG PO TABS: 100.0000 mg | ORAL_TABLET | Freq: Two times a day (BID) | ORAL | 0 refills | Status: AC

## 2023-11-28 MED ORDER — TRIAMCINOLONE ACETONIDE 0.5 % EX CREA: 1.0000 | TOPICAL_CREAM | Freq: Three times a day (TID) | CUTANEOUS | 1 refills | Status: AC

## 2023-11-28 NOTE — Assessment & Plan Note (Signed)
 ON Vit D

## 2023-11-28 NOTE — Assessment & Plan Note (Signed)
 On B12

## 2023-11-28 NOTE — Assessment & Plan Note (Addendum)
 Local reaction Start Triamcinolone  cream No signs of Lyme disease Labs  Doxy if fever etc

## 2023-11-28 NOTE — Assessment & Plan Note (Signed)
 No change

## 2023-11-28 NOTE — Progress Notes (Signed)
 Subjective:  Patient ID: [name], female    DOB: 16-Jun-1955  Age: 68 y.o. MRN: [id number]  CC: Medical Management of Chronic Issues (Left big Toe swelling at tick bite site... Redness and swelling x1 day... Tick was removed x2 weeks ago... Above sxs started x1 day ago. Pt has finished abx rx 11/27/2023.)   HPI [name] presents for tick bite 2 wks ago lesion on the L 2nd toe, itchy  Outpatient Medications Prior to Visit  Medication Sig Dispense Refill   AUVI-Q  0.3 MG/0.3ML SOAJ injection Inject 0.3 mg into the muscle as needed for anaphylaxis. Use as directed for life-threatening allergic reaction. 2 each 1   cetirizine  (ZYRTEC ) 10 MG tablet Take 1 tablet (10 mg total) by mouth daily as needed for allergies (Can take na extra dose during flare ups.). 180 tablet 3   Cholecalciferol  (EQL VITAMIN D3) 1000 units tablet Take 2 tablets (2,000 Units total) by mouth daily. 2 tabs po daily 100 tablet 3   clobetasol  ointment (TEMOVATE ) 0.05 % Apply 1 Application topically 2 (two) times daily. 30 g 0   cyclobenzaprine  (FLEXERIL ) 5 MG tablet Take 1 tablet (5 mg total) by mouth 3 (three) times daily as needed for muscle spasms. 30 tablet 1   diphenoxylate -atropine  (LOMOTIL ) 2.5-0.025 MG tablet Take 1 tablet by mouth 4 (four) times daily as needed for diarrhea or loose stools. 60 tablet 0   fluconazole  (DIFLUCAN ) 150 MG tablet Take 1 tablet (150 mg total) by mouth every 3 (three) days. 2 tablet 0   fluticasone  (FLONASE ) 50 MCG/ACT nasal spray Place 2 sprays into both nostrils daily. 16 g 0   gabapentin (NEURONTIN) 300 MG capsule Take 300 mg by mouth 3 (three) times daily.     HYDROcodone -acetaminophen  (NORCO/VICODIN) 5-325 MG tablet Take 1 tablet by mouth every 4 (four) hours as needed for severe pain (pain score 7-10). 20 tablet 0   levocetirizine (XYZAL ) 5 MG tablet Take 1 tablet (5 mg total) by mouth every evening. 15 tablet 0   ondansetron  (ZOFRAN ) 4 MG tablet Take 1 tablet (4 mg total) by  mouth every 8 (eight) hours as needed for nausea or vomiting. 20 tablet 0   predniSONE  (DELTASONE ) 10 MG tablet Prednisone  10 mg: take 4 tabs a day x 3 days; then 3 tabs a day x 4 days; then 2 tabs a day x 4 days, then 1 tab a day x 6 days, then stop. Take pc. 38 tablet 1   doxycycline  (VIBRA -TABS) 100 MG tablet Take 1 tablet (100 mg total) by mouth 2 (two) times daily. 20 tablet 0   No facility-administered medications prior to visit.    ROS: Review of Systems  Constitutional:  Negative for activity change, appetite change, chills, diaphoresis, fatigue, fever and unexpected weight change.  HENT:  Negative for congestion, mouth sores and sinus pressure.   Eyes:  Negative for visual disturbance.  Respiratory:  Negative for cough and chest tightness.   Gastrointestinal:  Negative for abdominal pain and nausea.  Genitourinary:  Negative for difficulty urinating, frequency and vaginal pain.  Musculoskeletal:  Negative for arthralgias, back pain, gait problem and myalgias.  Skin:  Positive for color change. Negative for pallor and rash.  Neurological:  Negative for dizziness, tremors, weakness, numbness and headaches.  Psychiatric/Behavioral:  Negative for confusion and sleep disturbance.     Objective:  BP 106/62   Pulse 68   Temp 97.9 F (36.6 C) (Oral)   Ht 5' 4 (1.626 m)  Wt 155 lb (70.3 kg)   SpO2 97%   BMI 26.61 kg/m   BP Readings from Last 3 Encounters:  11/28/23 106/62  11/13/23 114/80  07/04/23 112/68    Wt Readings from Last 3 Encounters:  11/28/23 155 lb (70.3 kg)  11/13/23 153 lb (69.4 kg)  07/04/23 150 lb (68 kg)    Physical Exam Constitutional:      General: She is not in acute distress.    Appearance: She is well-developed.  HENT:     Head: Normocephalic.     Right Ear: External ear normal.     Left Ear: External ear normal.     Nose: Nose normal.   Eyes:     General:        Right eye: No discharge.        Left eye: No discharge.      Conjunctiva/sclera: Conjunctivae normal.     Pupils: Pupils are equal, round, and reactive to light.   Neck:     Thyroid : No thyromegaly.     Vascular: No JVD.     Trachea: No tracheal deviation.   Cardiovascular:     Rate and Rhythm: Normal rate and regular rhythm.     Heart sounds: Normal heart sounds.  Pulmonary:     Effort: No respiratory distress.     Breath sounds: No stridor. No wheezing.  Abdominal:     General: Bowel sounds are normal. There is no distension.     Palpations: Abdomen is soft. There is no mass.     Tenderness: There is no abdominal tenderness. There is no guarding or rebound.   Musculoskeletal:        General: No tenderness.     Cervical back: Normal range of motion and neck supple. No rigidity.  Lymphadenopathy:     Cervical: No cervical adenopathy.   Skin:    Findings: No erythema or rash.   Neurological:     Cranial Nerves: No cranial nerve deficit.     Motor: No abnormal muscle tone.     Coordination: Coordination normal.     Deep Tendon Reflexes: Reflexes normal.   Psychiatric:        Behavior: Behavior normal.        Thought Content: Thought content normal.        Judgment: Judgment normal.   4 mm tick bite papule on the L 2nd toe. No rash  Lab Results  Component Value Date   WBC 6.6 11/24/2022   HGB 13.8 11/24/2022   HCT 41.8 11/24/2022   PLT 207.0 11/24/2022   GLUCOSE 93 07/04/2023   CHOL 227 (H) 11/24/2022   TRIG 75.0 11/24/2022   HDL 66.00 11/24/2022   LDLCALC 146 (H) 11/24/2022   ALT 94 (H) 07/04/2023   AST 63 (H) 07/04/2023   NA 140 07/04/2023   K 4.1 07/04/2023   CL 102 07/04/2023   CREATININE 0.73 07/04/2023   BUN 13 07/04/2023   CO2 30 07/04/2023   TSH 2.89 07/04/2023    MM 3D SCREENING MAMMOGRAM BILATERAL BREAST Result Date: 11/08/2023 CLINICAL DATA:  Screening. EXAM: DIGITAL SCREENING BILATERAL MAMMOGRAM WITH TOMOSYNTHESIS AND CAD TECHNIQUE: Bilateral screening digital craniocaudal and mediolateral oblique  mammograms were obtained. Bilateral screening digital breast tomosynthesis was performed. The images were evaluated with computer-aided detection. COMPARISON:  Previous exam(s). ACR Breast Density Category b: There are scattered areas of fibroglandular density. FINDINGS: There are no findings suspicious for malignancy. IMPRESSION: No mammographic evidence of malignancy. A result letter  of this screening mammogram will be mailed directly to the patient. RECOMMENDATION: Screening mammogram in one year. (Code:SM-B-01Y) BI-RADS CATEGORY  1: Negative. Electronically Signed   By: Debby Satterfield M.D.   On: 11/08/2023 10:43    Assessment & Plan:   Problem List Items Addressed This Visit     B12 deficiency   On B12      Vitamin D  deficiency   ON Vit D      URI   Low back pain   No change      Tick bite of left great toe - Primary   Local reaction Start Triamcinolone  cream No signs of Lyme disease Labs  Doxy if fever etc       Relevant Orders   Lyme Disease Abs IgG, IgM, IFA, CSF      Meds ordered this encounter  Medications   triamcinolone  cream (KENALOG ) 0.5 %    Sig: Apply 1 Application topically 3 (three) times daily.    Dispense:  30 g    Refill:  1   doxycycline  (VIBRA -TABS) 100 MG tablet    Sig: Take 1 tablet (100 mg total) by mouth 2 (two) times daily.    Dispense:  20 tablet    Refill:  0      Follow-up: Return in about 6 months (around 05/30/2024) for Wellness Exam.  Marolyn Noel, MD

## 2023-12-21 ENCOUNTER — Other Ambulatory Visit: Payer: Self-pay

## 2023-12-21 ENCOUNTER — Encounter: Payer: Self-pay | Admitting: Sports Medicine

## 2023-12-21 ENCOUNTER — Ambulatory Visit: Admitting: Sports Medicine

## 2023-12-21 VITALS — BP 132/68 | Ht 64.0 in | Wt 155.0 lb

## 2023-12-21 DIAGNOSIS — M7701 Medial epicondylitis, right elbow: Secondary | ICD-10-CM | POA: Diagnosis not present

## 2023-12-21 DIAGNOSIS — M25521 Pain in right elbow: Secondary | ICD-10-CM

## 2023-12-21 DIAGNOSIS — S76311A Strain of muscle, fascia and tendon of the posterior muscle group at thigh level, right thigh, initial encounter: Secondary | ICD-10-CM

## 2023-12-21 MED ORDER — NITROGLYCERIN 0.2 MG/HR TD PT24: 0.2000 mg | MEDICATED_PATCH | Freq: Every day | TRANSDERMAL | 1 refills | Status: AC

## 2023-12-21 NOTE — Progress Notes (Signed)
 PCP: Plotnikov, Karlynn GAILS, MD  Subjective:   HPI: Patient is a 68 y.o. female here for evaluation of right medial elbow pain and right hamstring pain.  She endorses a 44-month history of pain along the medial aspect of the right elbow.  There was no trauma at the onset of right elbow pain.  Pain is elicited with extension extension at the elbow and flexion activities such as lifting her grandchildren.  She does not feel limited in completing activities because of pain and has largely just been tolerating the discomfort.  She presents today due to the persistence of pain.  She additionally endorses right hamstring pain for the last 4 months.  She noted pain in one morning after completing a lower body workout that involve dead lifts.  Plan fluctuates in severity and is exacerbated when lowering the bar after completing a dead lift.  She has not tried taking any medication for pain relief.  Past Medical History:  Diagnosis Date   Allergy    GI upset from allergies to fish etc   Anemia    long ago- past hx    Cataract    mild   GERD (gastroesophageal reflux disease)    Hereditary angioedema (HCC)    high IgE levels   Hives    history of- cold exposure related   Hx of small bowel obstruction    due to hereditary andioedema- triggered by salmon    Osteopenia 07/2017   T score -1.9 FRAX 9% / 1.1%    Current Outpatient Medications on File Prior to Visit  Medication Sig Dispense Refill   AUVI-Q  0.3 MG/0.3ML SOAJ injection Inject 0.3 mg into the muscle as needed for anaphylaxis. Use as directed for life-threatening allergic reaction. 2 each 1   cetirizine  (ZYRTEC ) 10 MG tablet Take 1 tablet (10 mg total) by mouth daily as needed for allergies (Can take na extra dose during flare ups.). 180 tablet 3   Cholecalciferol  (EQL VITAMIN D3) 1000 units tablet Take 2 tablets (2,000 Units total) by mouth daily. 2 tabs po daily 100 tablet 3   clobetasol  ointment (TEMOVATE ) 0.05 % Apply 1 Application  topically 2 (two) times daily. 30 g 0   cyclobenzaprine  (FLEXERIL ) 5 MG tablet Take 1 tablet (5 mg total) by mouth 3 (three) times daily as needed for muscle spasms. 30 tablet 1   diphenoxylate -atropine  (LOMOTIL ) 2.5-0.025 MG tablet Take 1 tablet by mouth 4 (four) times daily as needed for diarrhea or loose stools. 60 tablet 0   doxycycline  (VIBRA -TABS) 100 MG tablet Take 1 tablet (100 mg total) by mouth 2 (two) times daily. 20 tablet 0   fluconazole  (DIFLUCAN ) 150 MG tablet Take 1 tablet (150 mg total) by mouth every 3 (three) days. 2 tablet 0   fluticasone  (FLONASE ) 50 MCG/ACT nasal spray Place 2 sprays into both nostrils daily. 16 g 0   gabapentin (NEURONTIN) 300 MG capsule Take 300 mg by mouth 3 (three) times daily.     HYDROcodone -acetaminophen  (NORCO/VICODIN) 5-325 MG tablet Take 1 tablet by mouth every 4 (four) hours as needed for severe pain (pain score 7-10). 20 tablet 0   levocetirizine (XYZAL ) 5 MG tablet Take 1 tablet (5 mg total) by mouth every evening. 15 tablet 0   ondansetron  (ZOFRAN ) 4 MG tablet Take 1 tablet (4 mg total) by mouth every 8 (eight) hours as needed for nausea or vomiting. 20 tablet 0   predniSONE  (DELTASONE ) 10 MG tablet Prednisone  10 mg: take 4 tabs a day  x 3 days; then 3 tabs a day x 4 days; then 2 tabs a day x 4 days, then 1 tab a day x 6 days, then stop. Take pc. 38 tablet 1   triamcinolone  cream (KENALOG ) 0.5 % Apply 1 Application topically 3 (three) times daily. 30 g 1   No current facility-administered medications on file prior to visit.    Past Surgical History:  Procedure Laterality Date   COLONOSCOPY  2009   DB    EXPLORATORY LAPAROTOMY     UPPER GASTROINTESTINAL ENDOSCOPY      Allergies  Allergen Reactions   Penicillins Anaphylaxis    REACTION: Anyphylactic shock    BP 132/68 (BP Location: Left Arm, Patient Position: Sitting)   Ht 5' 4 (1.626 m)   Wt 155 lb (70.3 kg)   BMI 26.61 kg/m      12/24/2020    2:39 PM 04/01/2021    8:44 AM   [facility]  Frequency of aerobic exercise (# of days/week) 6 5  Average time in minutes 30 30  Frequency of strengthening activities (# of days/week) 4 3        No data to display              Objective:  Physical Exam:  Gen: NAD, comfortable in exam room  Right elbow No obvious deformity on inspection of the right elbow There is tenderness to palpation over both the medial and lateral epicondyles.  Pain is more significant over the medial epicondyle Active range of motion is intact Pain is elicited with resisted flexion of the second digit of the right hand No significant pain is elicited with resisted pronation or supination The right upper extremity is neurovascularly intact  Right hamstring No obvious deformity on inspection of the right lower extremity There is tenderness palpation over the proximal hamstring at the site of insertion at the ischial tuberosity Full active range of motion at the hip and knee 5/5 strength with flexion/extension at the knee and abduction/adduction at the hip. Pain is elicited along the hamstring with resisted flexion of the knee There is no pain with resisted extension at the knee or with abduction/abduction at the hip Positive H test  Limited [facility] Ultrasound Right Elbow Limited ultrasound of the right elbow shows partial separation of the common flexor tendon from the medial epicondyle with adjacent hypoechoic space and calcific tendinopathy.  There is also evidence of a slight avulsion fracture at the medial epicondyle.  Ultrasound and interpretation by Helene NOVAK. Harvey, MD and Manus Fireman, MD.    Assessment & Plan:  1. Right Medial Elbow Pain Present for 7 months.  Pain is worse with extension of the elbow and resisted flexion of the second digit of the right hand.  Limited ultrasound of the right elbow is significant for partial separation of the common flexor tendon from the medial epicondyle with adjacent  hypoechoic space and calcific tendinopathy.  History, exam, and imaging findings today are suggestive of medial condylitis.  Treatment options reviewed.  Recommend starting with nitroglycerin  patches and home physical therapy exercises largely focusing on wrist flexion and pronation/supination. We also discussed limiting overhead exercises involving extension at the elbow, such a presses, that elicit pain. Follow-up in 6 weeks for reassessment.  Could consider shockwave therapy if there is no significant improvement.  2. Right hamstring pain Onset around 4 months ago with fluctuating severity.  Pain is exacerbated by deadlifts, particularly with lowering the bar.  On exam  there is tenderness over the ischial tuberosity at the site of hamstring insertion.  Pain is also elicited with resisted flexion at the knee.  Positive H test.  History and exam findings are most consistent with a hamstring strain.  Treatment options reviewed.  Will start with home physical therapy exercises following the [place] series.  Recommend a gradual return to weightlifting using pain as her guide.  Reassess at follow-up in 6 weeks.

## 2023-12-21 NOTE — Patient Instructions (Signed)

## 2023-12-22 ENCOUNTER — Encounter: Payer: Self-pay | Admitting: Sports Medicine

## 2024-02-01 ENCOUNTER — Ambulatory Visit: Admitting: Sports Medicine

## 2024-02-27 ENCOUNTER — Ambulatory Visit: Admitting: Sports Medicine

## 2024-03-20 DIAGNOSIS — L309 Dermatitis, unspecified: Secondary | ICD-10-CM | POA: Diagnosis not present

## 2024-03-29 ENCOUNTER — Ambulatory Visit

## 2024-03-29 VITALS — BP 120/70 | HR 72 | Ht 64.75 in | Wt 152.4 lb

## 2024-03-29 DIAGNOSIS — Z23 Encounter for immunization: Secondary | ICD-10-CM | POA: Diagnosis not present

## 2024-03-29 DIAGNOSIS — Z0000 Encounter for general adult medical examination without abnormal findings: Secondary | ICD-10-CM | POA: Diagnosis not present

## 2024-03-29 DIAGNOSIS — Z1211 Encounter for screening for malignant neoplasm of colon: Secondary | ICD-10-CM

## 2024-03-29 NOTE — Progress Notes (Cosign Needed Addendum)
 Subjective:   [name] is a 68 y.o. who presents for a Medicare Wellness preventive visit.  As a reminder, Annual Wellness Visits don't include a physical exam, and some assessments may be limited, especially if this visit is performed virtually. We may recommend an in-person follow-up visit with your provider if needed.  Visit Complete: In person  Persons Participating in Visit: Patient.  AWV Questionnaire: No: Patient Medicare AWV questionnaire was not completed prior to this visit.  Cardiac Risk Factors include: advanced age (>63men, >5 women)     Objective:    Today's Vitals   03/29/24 1142  BP: 120/70  Pulse: 72  SpO2: 99%  Weight: 152 lb 6.4 oz (69.1 kg)  Height: 5' 4.75 (1.645 m)   Body mass index is 25.56 kg/m.     03/29/2024   11:50 AM 11/07/2017    8:11 AM 11/28/2014   11:31 AM  Advanced Directives  Does Patient Have a Medical Advance Directive? Yes Yes  No   Type of Estate Agent of [place];Living will    Copy of Healthcare Power of Attorney in Chart? No - copy requested    Would patient like information on creating a medical advance directive?   No - patient declined information      Data saved with a previous flowsheet row definition    Current Medications (verified) Outpatient Encounter Medications as of 03/29/2024  Medication Sig   AUVI-Q  0.3 MG/0.3ML SOAJ injection Inject 0.3 mg into the muscle as needed for anaphylaxis. Use as directed for life-threatening allergic reaction.   cetirizine  (ZYRTEC ) 10 MG tablet Take 1 tablet (10 mg total) by mouth daily as needed for allergies (Can take na extra dose during flare ups.).   Cholecalciferol  (EQL VITAMIN D3) 1000 units tablet Take 2 tablets (2,000 Units total) by mouth daily. 2 tabs po daily   clobetasol  ointment (TEMOVATE ) 0.05 % Apply 1 Application topically 2 (two) times daily.   diphenoxylate -atropine  (LOMOTIL ) 2.5-0.025 MG tablet Take 1 tablet by mouth 4 (four) times daily  as needed for diarrhea or loose stools.   triamcinolone  cream (KENALOG ) 0.5 % Apply 1 Application topically 3 (three) times daily.   cyclobenzaprine  (FLEXERIL ) 5 MG tablet Take 1 tablet (5 mg total) by mouth 3 (three) times daily as needed for muscle spasms. (Patient not taking: Reported on 03/29/2024)   doxycycline  (VIBRA -TABS) 100 MG tablet Take 1 tablet (100 mg total) by mouth 2 (two) times daily. (Patient not taking: Reported on 03/29/2024)   fluconazole  (DIFLUCAN ) 150 MG tablet Take 1 tablet (150 mg total) by mouth every 3 (three) days. (Patient not taking: Reported on 03/29/2024)   fluticasone  (FLONASE ) 50 MCG/ACT nasal spray Place 2 sprays into both nostrils daily. (Patient not taking: Reported on 03/29/2024)   gabapentin (NEURONTIN) 300 MG capsule Take 300 mg by mouth 3 (three) times daily. (Patient not taking: Reported on 03/29/2024)   HYDROcodone -acetaminophen  (NORCO/VICODIN) 5-325 MG tablet Take 1 tablet by mouth every 4 (four) hours as needed for severe pain (pain score 7-10). (Patient not taking: Reported on 03/29/2024)   levocetirizine (XYZAL ) 5 MG tablet Take 1 tablet (5 mg total) by mouth every evening. (Patient not taking: Reported on 03/29/2024)   nitroGLYCERIN  (NITRODUR - DOSED IN MG/24 HR) 0.2 mg/hr patch Place 1 patch (0.2 mg total) onto the skin daily. Use 1/4 patch to the affected area (Patient not taking: Reported on 03/29/2024)   ondansetron  (ZOFRAN ) 4 MG tablet Take 1 tablet (4 mg total) by mouth every  8 (eight) hours as needed for nausea or vomiting. (Patient not taking: Reported on 03/29/2024)   predniSONE  (DELTASONE ) 10 MG tablet Prednisone  10 mg: take 4 tabs a day x 3 days; then 3 tabs a day x 4 days; then 2 tabs a day x 4 days, then 1 tab a day x 6 days, then stop. Take pc. (Patient not taking: Reported on 03/29/2024)   No facility-administered encounter medications on file as of 03/29/2024.    Allergies (verified) Penicillins   History: Past Medical History:   Diagnosis Date   Allergy    GI upset from allergies to fish etc   Anemia    long ago- past hx    Cataract    mild   GERD (gastroesophageal reflux disease)    Hereditary angioedema (HCC)    high IgE levels   Hives    history of- cold exposure related   Hx of small bowel obstruction    due to hereditary andioedema- triggered by salmon    Osteopenia 07/2017   T score -1.9 FRAX 9% / 1.1%   Past Surgical History:  Procedure Laterality Date   COLONOSCOPY  2009   DB    EXPLORATORY LAPAROTOMY     UPPER GASTROINTESTINAL ENDOSCOPY     Family History  Problem Relation Age of Onset   Parkinsonism Mother    Colon cancer Mother 59   Macular degeneration Mother    Allergy (severe) Father        hives   Rheum arthritis Father    Allergy (severe) Daughter        hives   Angioedema Daughter        [facility]ologist at [facility]   Urticaria Daughter    Breast cancer Paternal Aunt    Colon polyps Neg Hx    Rectal cancer Neg Hx    Stomach cancer Neg Hx    Social History   Socioeconomic History   Marital status: Married    Spouse name: Not on file   Number of children: Not on file   Years of education: Not on file   Highest education level: Not on file  Occupational History   Occupation: RETIRED  Tobacco Use   Smoking status: Never   Smokeless tobacco: Never  Vaping Use   Vaping status: Never Used  Substance and Sexual Activity   Alcohol use: Yes    Alcohol/week: 0.0 standard drinks of alcohol    Comment: socially    Drug use: No   Sexual activity: Yes    Comment: INTERCOURSE AGE 68, SEXUAL PARTNERS LESS THAN 5  Other Topics Concern   Not on file  Social History Narrative   Lives with husband/2025   Social Drivers of Health   Financial Resource Strain: Patient Declined (03/29/2024)   Overall Financial Resource Strain (CARDIA)    Difficulty of Paying Living Expenses: Patient declined  Food Insecurity: Patient Declined (03/29/2024)   Hunger Vital Sign    Worried  About Running Out of Food in the Last Year: Patient declined    Ran Out of Food in the Last Year: Patient declined  Transportation Needs: No Transportation Needs (03/29/2024)   PRAPARE - Administrator, Civil Service (Medical): No    Lack of Transportation (Non-Medical): No  Physical Activity: Sufficiently Active (03/29/2024)   Exercise Vital Sign    Days of Exercise per Week: 3 days    Minutes of Exercise per Session: 60 min  Stress: No Stress Concern Present (03/29/2024)  [employer] of Occupational Health - Occupational Stress Questionnaire    Feeling of Stress: Not at all  Social Connections: Socially Integrated (03/29/2024)   Social Connection and Isolation Panel    Frequency of Communication with Friends and Family: More than three times a week    Frequency of Social Gatherings with Friends and Family: Three times a week    Attends Religious Services: More than 4 times per year    Active Member of Clubs or Organizations: Yes    Attends Banker Meetings: 1 to 4 times per year    Marital Status: Married    Tobacco Counseling Counseling given: Not Answered    Clinical Intake:  Pre-visit preparation completed: Yes  Pain : No/denies pain     BMI - recorded: 25.26 Nutritional Status: BMI 25 -29 Overweight Nutritional Risks: None Diabetes: No  No results found for: HGBA1C   How often do you need to have someone help you when you read instructions, pamphlets, or other written materials from your doctor or pharmacy?: 1 - Never  Interpreter Needed?: No  Information entered by :: Cybil Senegal, RMA   Activities of Daily Living     03/29/2024   11:43 AM  In your present state of health, do you have any difficulty performing the following activities:  Hearing? 0  Vision? 0  Difficulty concentrating or making decisions? 0  Walking or climbing stairs? 0  Dressing or bathing? 0  Doing errands, shopping? 0  Preparing Food and eating ?  N  Using the Toilet? N  In the past six months, have you accidently leaked urine? N  Do you have problems with loss of bowel control? N  Managing your Medications? N  Managing your Finances? N  Housekeeping or managing your Housekeeping? N    Patient Care Team: Plotnikov, Karlynn GAILS, MD as PCP - General Nandigam, Kavitha V, MD as Consulting Physician (Gastroenterology) Patrcia Sharper, MD as Consulting Physician (Ophthalmology) Prentiss Annabella LABOR, NP as Nurse Practitioner (Gynecology)  I have updated your Care Teams any recent Medical Services you may have received from other providers in the past year.     Assessment:   This is a routine wellness examination for [name].  Hearing/Vision screen Hearing Screening - Comments:: Denies hearing difficulties   Vision Screening - Comments:: Wears eyeglasses/Dr. Florentina pt-up to date   Goals Addressed             This Visit's Progress    Patient Stated       Maintain muscle mass/2025       Depression Screen     03/29/2024   11:54 AM 07/04/2023    8:56 AM 07/21/2022    7:58 AM 07/20/2021    7:55 AM 10/07/2016    8:21 AM 11/28/2014   11:31 AM 11/28/2014   11:30 AM  PHQ 2/9 Scores  PHQ - 2 Score 0 0 0 0 0 0 0  PHQ- 9 Score 0        Exception Documentation      Other- indicate reason in comment box Other- indicate reason in comment box    Fall Risk     03/29/2024   11:51 AM 07/04/2023    8:56 AM 07/21/2022    7:58 AM 10/07/2016    8:21 AM 11/28/2014   11:31 AM  Fall Risk   Falls in the past year? 0 0 0 No  No   Number falls in past yr: 0 0 0  Injury with Fall? 0 0 0    Risk for fall due to :  No Fall Risks No Fall Risks  Other (Comment)   Follow up Falls evaluation completed;Falls prevention discussed Falls evaluation completed Falls evaluation completed       Data saved with a previous flowsheet row definition    MEDICARE RISK AT HOME:  Medicare Risk at Home Any stairs in or around the home?: Yes If so, are there any  without handrails?: No Home free of loose throw rugs in walkways, pet beds, electrical cords, etc?: Yes Adequate lighting in your home to reduce risk of falls?: Yes Life alert?: No Use of a cane, walker or w/c?: No Grab bars in the bathroom?: Yes Shower chair or bench in shower?: No Elevated toilet seat or a handicapped toilet?: No  TIMED UP AND GO:  Was the test performed?  Yes  Length of time to ambulate 10 feet: 10 sec Gait steady and fast without use of assistive device  Cognitive Function: Declined/Normal: No cognitive concerns noted by patient or family. Patient alert, oriented, able to answer questions appropriately and recall recent events. No signs of memory loss or confusion.        Immunizations Immunization History  Administered Date(s) Administered   INFLUENZA, HIGH DOSE SEASONAL PF 01/29/2023, 03/29/2024   Influenza Inj Mdck Quad Pf 03/20/2022   Influenza Inj Mdck Quad With Preservative 03/05/2018   Influenza Whole 02/28/2012   Influenza, Seasonal, Injecte, Preservative Fre 03/22/2017   Influenza,inj,Quad PF,6+ Mos 02/27/2019   Influenza-Unspecified 02/27/2013, 03/06/2016   Moderna SARS-COV2 Booster Vaccination 07/07/2020   PFIZER(Purple Top)SARS-COV-2 Vaccination 08/29/2019   Td 09/21/2007   Tdap 10/13/2017   Zoster, Live 06/15/2012    Screening Tests Health Maintenance  Topic Date Due   Zoster Vaccines- Shingrix (1 of 2) 08/09/1976   Pneumococcal Vaccine: 50+ Years (1 of 1 - PCV) Never done   COVID-19 Vaccine (2 - Pfizer risk series) 07/28/2020   Colonoscopy  11/08/2022   Medicare Annual Wellness (AWV)  03/29/2025   Mammogram  11/01/2025   DTaP/Tdap/Td (3 - Td or Tdap) 10/14/2027   Influenza Vaccine  Completed   DEXA SCAN  Completed   Hepatitis C Screening  Completed   Meningococcal B Vaccine  Aged Out    Health Maintenance Items Addressed: Vaccines Given today: Flu, Vaccines Due: pneumonia, Referral sent to GI for colonoscopy, See Nurse Notes at  the end of this note  Additional Screening:  Vision Screening: Recommended annual ophthalmology exams for early detection of glaucoma and other disorders of the eye. Is the patient up to date with their annual eye exam?  Yes  Who is the provider or what is the name of the office in which the patient attends annual eye exams? Dr. Florentina pt-up to date.  Dental Screening: Recommended annual dental exams for proper oral hygiene  Community Resource Referral / Chronic Care Management: CRR required this visit?  No   CCM required this visit?  No   Plan:    I have personally reviewed and noted the following in the patient's chart:   Medical and social history Use of alcohol, tobacco or illicit drugs  Current medications and supplements including opioid prescriptions. Patient is not currently taking opioid prescriptions. Functional ability and status Nutritional status Physical activity Advanced directives List of other physicians Hospitalizations, surgeries, and ER visits in previous 12 months Vitals Screenings to include cognitive, depression, and falls Referrals and appointments  In addition, I have reviewed and discussed  with patient certain preventive protocols, quality metrics, and best practice recommendations. A written personalized care plan for preventive services as well as general preventive health recommendations were provided to patient.   Saina Waage L Nan Maya, CMA   03/29/2024   After Visit Summary: (MyChart) Due to this being a telephonic visit, the after visit summary with patients personalized plan was offered to patient via MyChart   Notes: Patient is due for a colonoscopy and order has been placed today.  She is due for a pneumonia vaccine and would like to discuss further with Dr. Garald during his next office visit.  Patient had no other concerns to address with me today.  Medical screening examination/treatment/procedure(s) were performed by non-physician  practitioner and as supervising physician I was immediately available for consultation/collaboration.  I agree with above. Karlynn Garald, MD

## 2024-03-29 NOTE — Patient Instructions (Addendum)
 [name],  Thank you for taking the time for your Medicare Wellness Visit. I appreciate your continued commitment to your health goals. Please review the care plan we discussed, and feel free to reach out if I can assist you further.  Medicare recommends these wellness visits once per year to help you and your care team stay ahead of potential health issues. These visits are designed to focus on prevention, allowing your provider to concentrate on managing your acute and chronic conditions during your regular appointments.  Please note that Annual Wellness Visits do not include a physical exam. Some assessments may be limited, especially if the visit was conducted virtually. If needed, we may recommend a separate in-person follow-up with your provider.  Ongoing Care Seeing your primary care provider every 3 to 6 months helps us  monitor your health and provide consistent, personalized care. Last office visit on 11/28/2023.  You are due for a pneumonia vaccine.  Keep up the good work.  Referrals If a referral was made during today's visit and you haven't received any updates within two weeks, please contact the referred provider directly to check on the status.  Please call below to get scheduled for a colonoscopy:  [name], Located in: [name] [address] Address: [address], [name], [place] [address] Phone: [phone]  Recommended Screenings:  Health Maintenance  Topic Date Due   Zoster (Shingles) Vaccine (1 of 2) 08/09/1976   Pneumococcal Vaccine for age over 53 (1 of 1 - PCV) Never done   COVID-19 Vaccine (2 - Pfizer risk series) 07/28/2020   Colon Cancer Screening  11/08/2022   Flu Shot  12/29/2023   Medicare Annual Wellness Visit  03/29/2025   Breast Cancer Screening  11/01/2025   DTaP/Tdap/Td vaccine (3 - Td or Tdap) 10/14/2027   DEXA scan (bone density measurement)  Completed   Hepatitis C Screening  Completed    Meningitis B Vaccine  Aged Out       11/07/2017    8:11 AM  Advanced Directives  Does Patient Have a Medical Advance Directive? Yes      Data saved with a previous flowsheet row definition   Advance Care Planning is important because it: Ensures you receive medical care that aligns with your values, goals, and preferences. Provides guidance to your family and loved ones, reducing the emotional burden of decision-making during critical moments.  Vision: Annual vision screenings are recommended for early detection of glaucoma, cataracts, and diabetic retinopathy. These exams can also reveal signs of chronic conditions such as diabetes and high blood pressure.  Dental: Annual dental screenings help detect early signs of oral cancer, gum disease, and other conditions linked to overall health, including heart disease and diabetes.  Please see the attached documents for additional preventive care recommendations.

## 2024-04-09 ENCOUNTER — Ambulatory Visit: Admitting: Sports Medicine

## 2024-04-09 VITALS — BP 104/68 | Ht 64.96 in | Wt 150.0 lb

## 2024-04-09 DIAGNOSIS — S76311S Strain of muscle, fascia and tendon of the posterior muscle group at thigh level, right thigh, sequela: Secondary | ICD-10-CM | POA: Diagnosis not present

## 2024-04-09 DIAGNOSIS — M25521 Pain in right elbow: Secondary | ICD-10-CM | POA: Diagnosis not present

## 2024-04-09 DIAGNOSIS — G8929 Other chronic pain: Secondary | ICD-10-CM

## 2024-04-09 NOTE — Assessment & Plan Note (Signed)
 Patient still has some generalized symptoms about the elbow but no real medial or lateral epicondylitis on today's exam I think certain think she is doing tend to strain her brachial radialis muscle and perhaps some of the extensor muscles for her lateral digits She was given a series of exercises to work brachial radialis Continue to work biceps flexion and forearm rotation Add grip strength  She will do these at home and if is not resolving she will return for our evaluation

## 2024-04-09 NOTE — Progress Notes (Signed)
 Chief complaint follow-up of right elbow and right hamstring  The good news is that since the patient's last visit her right hamstring issues have pretty much resolved.  The only residual is some tightness when she does the stretching.  She was faithful on the [place] exercises. Now the only symptoms she feels with exercises occasionally some tightness over the ischial tuberosity insertion of the right hamstring.  Problem #2 was medial epicondylitis.  She felt this had an excellent response to nitroglycerin  and the pain went away with flexion rotation exercises and the use of nitroglycerin  patches.  However she does feel now a little sharp pain over her lateral elbow particularly with reaching out to lift a pocketbook with the hand down and the elbow fully extended.  That pain extends above her elbow and below the elbow.  She does not get any neurologic symptoms of burning or tingling or stiffness.  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  She has read some of the literature and has purchased some blood flow restriction devices to see if she can improve her strength.  She continues weight work with presses bench presses and occasionally some other major exercises and is doing those without pain  Physical exam Pleasant female in no acute distress BP 104/68   Ht 5' 4.96 (1.65 m)   Wt 150 lb (68 kg)   BMI 24.99 kg/m   Right elbow shows full range of motion She has excellent strength She can do the book test with no pain on full extension of the elbow The medial epicondyle has minimal if any tenderness Lateral epicondyle is nontender Her brachial radialis muscle is somewhat tender to palpation  Hamstring she shows good motion and can do the hamstring exercises without any pain

## 2024-04-09 NOTE — Assessment & Plan Note (Signed)
 She has done home exercises and has done very well with this  The only residual is some tightness at the ischial tuberosity insertion  Plan is to continue asdkling exercises at least 3 times a week as a prevention

## 2024-05-01 DIAGNOSIS — L739 Follicular disorder, unspecified: Secondary | ICD-10-CM | POA: Diagnosis not present

## 2024-05-01 DIAGNOSIS — L309 Dermatitis, unspecified: Secondary | ICD-10-CM | POA: Diagnosis not present

## 2024-05-08 ENCOUNTER — Encounter: Payer: Self-pay | Admitting: Allergy and Immunology

## 2024-05-08 ENCOUNTER — Encounter: Payer: Self-pay | Admitting: Gastroenterology

## 2024-05-28 ENCOUNTER — Ambulatory Visit: Admitting: Sports Medicine

## 2024-05-28 VITALS — BP 108/72 | Ht 64.96 in | Wt 150.0 lb

## 2024-05-28 DIAGNOSIS — S76311S Strain of muscle, fascia and tendon of the posterior muscle group at thigh level, right thigh, sequela: Secondary | ICD-10-CM

## 2024-05-28 DIAGNOSIS — G8929 Other chronic pain: Secondary | ICD-10-CM

## 2024-05-28 DIAGNOSIS — M25521 Pain in right elbow: Secondary | ICD-10-CM

## 2024-05-28 NOTE — Progress Notes (Signed)
 Discussed the use of AI scribe software for clinical note transcription with the patient, who gave verbal consent to proceed.  History of Present Illness [name] is a 68 year old female with right hamstring strain who presents for follow-up of her hamstring injury and new bilateral elbow pain.  Right hamstring strain - Sustained injury while deadlifting with excessive weight - Reduced loads and advanced gradually since injury - Significant improvement in symptoms - Mild cramping in right calf and upper left leg during recent activity - Improved strength and flexibility with minimal discomfort  Bilateral medial elbow pain - New onset bilateral medial elbow pain localized along the cubital tunnel - Pain worsens with elbow flexion during arm exercises - No paresthesia or sensory changes in the fourth or fifth digits - Uses elbow sleeve for support during exercise  Physical Exam MUSCULOSKELETAL:  Pleasant F in NAD BP 108/72   Ht 5' 4.96 (1.65 m)   Wt 150 lb (68 kg)   BMI 24.99 kg/m   Good strength on finger flexion. Full range of motion of the elbow with good strength on flexion. Strong pronation and supination of the forearm.  Good hamstring strength in neutral foot position, with excellent strength in external and internal foot rotation. Good hamstring strength at 120 degrees knee flexion, with strong hamstring at 120 degrees knee flexion. 75 degrees hamstring extension on the right and 80 degrees on the left. No tenderness or numbness in fourth or fifth fingers.  Assessment & Plan Right hamstring strain Right hamstring strain resolving with minimal symptoms. Improved strength and flexibility noted. Responded well to gradual weight progression and modified lifting technique. - Reinforced gradual weight progression with deadlifts and other exercises. - Advised maintenance of current modified lifting technique. - Provided positive feedback on progress and management. Medial  elbow capsular strain bilateral elbows . Symptoms localized to cubital tunnel with elbow flexion, no ulnar nerve involvement. Examination shows full range of motion and strength. - Recommended modification of exercise technique to reduce elbow strain. - Provided elbow sleeve for symptomatic support during provocative activities.

## 2024-05-28 NOTE — Assessment & Plan Note (Signed)
 Improved with modification of lifting exercises and HEP  Reck prn

## 2024-05-28 NOTE — Assessment & Plan Note (Signed)
 See note and we will modify elbow flexion in most of her exercises Use elbow compression sleeves for lifting

## 2024-06-18 ENCOUNTER — Ambulatory Visit: Admitting: Allergy and Immunology

## 2024-06-19 ENCOUNTER — Telehealth: Payer: Self-pay | Admitting: Gastroenterology

## 2024-06-19 ENCOUNTER — Ambulatory Visit

## 2024-06-19 VITALS — Ht 64.0 in | Wt 150.0 lb

## 2024-06-19 DIAGNOSIS — Z800 Family history of malignant neoplasm of digestive organs: Secondary | ICD-10-CM

## 2024-06-19 DIAGNOSIS — Z860100 Personal history of colon polyps, unspecified: Secondary | ICD-10-CM

## 2024-06-19 MED ORDER — NA SULFATE-K SULFATE-MG SULF 17.5-3.13-1.6 GM/177ML PO SOLN: 1.0000 | Freq: Once | ORAL | 0 refills | Status: AC

## 2024-06-19 NOTE — Telephone Encounter (Signed)
 PV complete via telephone call

## 2024-06-19 NOTE — Progress Notes (Signed)
 PCP MD at time of PV: Plotnikov, MD __________________________________________________________________________________________________________________________________________  No egg allergy known to patient  No soy allergy known to patient No issues known to pt with past sedation with any surgeries or procedures Patient denies ever being told they had issues or difficulty with intubation  No FH of Malignant Hyperthermia Pt is not on diet pills Pt is not on  home 02  Pt is not on blood thinners  No A fib or A flutter Have any cardiac testing pending--no  LOA: independent  No Chew or Snuff tobacco __________________________________________________________________________________________________________________________________________  Constipation: on occasion  Prep: suprep  __________________________________________________________________________________________________________________________________________  PV completed with patient. Prep instructions reviewed and provided during apt. Rx sent to preferred pharmacy.  __________________________________________________________________________________________________________________________________________  Patient's chart reviewed by Norleen Schillings CNRA prior to previsit and patient appropriate for the [facility].  Previsit completed and red dot placed by patient's name on their procedure day (on provider's schedule).

## 2024-06-19 NOTE — Telephone Encounter (Signed)
 Incoming call from pt requesting to switch in person appointment to virtual. Pt scheduled for 1:00pm 06/19/2024. Please advise. Thank you

## 2024-06-24 ENCOUNTER — Encounter: Payer: Self-pay | Admitting: Gastroenterology

## 2024-07-03 ENCOUNTER — Ambulatory Visit: Admitting: Gastroenterology

## 2024-07-03 ENCOUNTER — Encounter: Payer: Self-pay | Admitting: Gastroenterology

## 2024-07-03 VITALS — BP 112/66 | HR 62 | Temp 97.3°F | Resp 13 | Ht 64.0 in | Wt 155.0 lb

## 2024-07-03 DIAGNOSIS — K644 Residual hemorrhoidal skin tags: Secondary | ICD-10-CM | POA: Diagnosis not present

## 2024-07-03 DIAGNOSIS — Z860100 Personal history of colon polyps, unspecified: Secondary | ICD-10-CM

## 2024-07-03 DIAGNOSIS — Z1211 Encounter for screening for malignant neoplasm of colon: Secondary | ICD-10-CM | POA: Diagnosis present

## 2024-07-03 DIAGNOSIS — K6389 Other specified diseases of intestine: Secondary | ICD-10-CM | POA: Diagnosis not present

## 2024-07-03 DIAGNOSIS — K648 Other hemorrhoids: Secondary | ICD-10-CM

## 2024-07-03 DIAGNOSIS — Z800 Family history of malignant neoplasm of digestive organs: Secondary | ICD-10-CM | POA: Diagnosis not present

## 2024-07-03 DIAGNOSIS — D123 Benign neoplasm of transverse colon: Secondary | ICD-10-CM

## 2024-07-03 DIAGNOSIS — K5730 Diverticulosis of large intestine without perforation or abscess without bleeding: Secondary | ICD-10-CM

## 2024-07-03 MED ORDER — SODIUM CHLORIDE 0.9 % IV SOLN: 500.0000 mL | Freq: Once | INTRAVENOUS | Status: DC

## 2024-07-03 NOTE — Progress Notes (Signed)
 Called to room to assist during endoscopic procedure.  Patient ID and intended procedure confirmed with present staff. Received instructions for my participation in the procedure from the performing physician.

## 2024-07-03 NOTE — Op Note (Signed)
 [facility] Patient Name: [name] Procedure Date: 07/03/2024 7:48 AM MRN: [id number] Endoscopist: Gustav ALONSO Mcgee , MD, [id number] Age: 69 Referring MD:  Date of Birth: [birth year] Gender: Female Account #: [id number] Procedure:                Colonoscopy Indications:              Screening in patient at increased risk: Colorectal                            cancer in mother 45 or older Medicines:                Monitored Anesthesia Care Procedure:                Pre-Anesthesia Assessment:                           - Prior to the procedure, a History and Physical                            was performed, and patient medications and                            allergies were reviewed. The patient's tolerance of                            previous anesthesia was also reviewed. The risks                            and benefits of the procedure and the sedation                            options and risks were discussed with the patient.                            All questions were answered, and informed consent                            was obtained. Prior Anticoagulants: The patient has                            taken no anticoagulant or antiplatelet agents. ASA                            Grade Assessment: II - A patient with mild systemic                            disease. After reviewing the risks and benefits,                            the patient was deemed in satisfactory condition to                            undergo the procedure.  After obtaining informed consent, the colonoscope                            was passed under direct vision. Throughout the                            procedure, the patient's blood pressure, pulse, and                            oxygen saturations were monitored continuously. The                            Olympus Scope SN: [id number] was introduced through                            the anus and advanced to  the the cecum, identified                            by appendiceal orifice and ileocecal valve. The                            colonoscopy was performed without difficulty. The                            patient tolerated the procedure well. The quality                            of the bowel preparation was good. The ileocecal                            valve, appendiceal orifice, and rectum were                            photographed. Scope In: 8:14:44 AM Scope Out: 8:28:14 AM Scope Withdrawal Time: 0 hours 8 minutes 54 seconds  Total Procedure Duration: 0 hours 13 minutes 30 seconds  Findings:                 The perianal and digital rectal examinations were                            normal.                           A 1 mm polyp was found in the transverse colon. The                            polyp was sessile. The polyp was removed with a                            cold biopsy forceps. Resection and retrieval were                            complete.  A few small-mouthed diverticula were found in the                            sigmoid colon.                           Non-bleeding external and internal hemorrhoids were                            found during retroflexion. The hemorrhoids were                            medium-sized. Complications:            No immediate complications. Estimated Blood Loss:     Estimated blood loss was minimal. Impression:               - One 1 mm polyp in the transverse colon, removed                            with a cold biopsy forceps. Resected and retrieved.                           - Diverticulosis in the sigmoid colon.                           - Non-bleeding external and internal hemorrhoids. Recommendation:           - Resume previous diet.                           - Continue present medications.                           - Await pathology results.                           - Repeat colonoscopy in 5 years for  surveillance. Japleen Tornow V. Ibtisam Benge, MD 07/03/2024 8:43:28 AM This report has been signed electronically.

## 2024-07-03 NOTE — Progress Notes (Signed)
 Pt's states no medical or surgical changes since previsit or office visit.

## 2024-07-03 NOTE — Patient Instructions (Signed)
 Read your discharge instructions. Your biopsy results will be back in about two weeks. Call if you need us .   YOU HAD AN ENDOSCOPIC PROCEDURE TODAY AT THE [facility]:   Refer to the procedure report that was given to you for any specific questions about what was found during the examination.  If the procedure report does not answer your questions, please call your gastroenterologist to clarify.  If you requested that your care partner not be given the details of your procedure findings, then the procedure report has been included in a sealed envelope for you to review at your convenience later.  YOU SHOULD EXPECT: Some feelings of bloating in the abdomen. Passage of more gas than usual.  Walking can help get rid of the air that was put into your GI tract during the procedure and reduce the bloating. If you had a lower endoscopy (such as a colonoscopy or flexible sigmoidoscopy) you may notice spotting of blood in your stool or on the toilet paper. If you underwent a bowel prep for your procedure, you may not have a normal bowel movement for a few days.  Please Note:  You might notice some irritation and congestion in your nose or some drainage.  This is from the oxygen used during your procedure.  There is no need for concern and it should clear up in a day or so.  SYMPTOMS TO REPORT IMMEDIATELY:  Following lower endoscopy (colonoscopy or flexible sigmoidoscopy):  Excessive amounts of blood in the stool  Significant tenderness or worsening of abdominal pains  Swelling of the abdomen that is new, acute  Fever of 100F or higher   For urgent or emergent issues, a gastroenterologist can be reached at any hour by calling [phone]. Do not use MyChart messaging for urgent concerns.   Try to increase the fiber in your diet, and drink plenty of water.  Drink plenty of fluids but you should avoid alcoholic beverages for 24 hours.  ACTIVITY:  You should plan to take it easy for the  rest of today and you should NOT DRIVE or use heavy machinery until tomorrow (because of the sedation medicines used during the test).    FOLLOW UP: Our staff will call the number listed on your records the next business day following your procedure.  We will call around 7:15- 8:00 am to check on you and address any questions or concerns that you may have regarding the information given to you following your procedure. If we do not reach you, we will leave a message.     If any biopsies were taken you will be contacted by phone or by letter within the next 1-3 weeks.  Please call us  at [phone] if you have not heard about the biopsies in 3 weeks.    SIGNATURES/CONFIDENTIALITY: You and/or your care partner have signed paperwork which will be entered into your electronic medical record.  These signatures attest to the fact that that the information above on your After Visit Summary has been reviewed and is understood.  Full responsibility of the confidentiality of this discharge information lies with you and/or your care-partner.

## 2024-07-03 NOTE — Progress Notes (Signed)
 Report given to PACU, vss

## 2024-07-03 NOTE — Progress Notes (Signed)
 [facility] History and Physical   Primary Care Physician:  Plotnikov, Karlynn GAILS, MD   Reason for Procedure:  Family history of colon cancer  Plan:    Screening colonoscopy with possible interventions as needed     HPI: [name] is a very pleasant 69 y.o. female here for screening colonoscopy. Denies any nausea, vomiting, abdominal pain, melena or bright red blood per rectum  The risks and benefits as well as alternatives of endoscopic procedure(s) have been discussed and reviewed. The patient was provided an opportunity to ask questions and all were answered. The patient agreed with the plan and demonstrated an understanding of the instructions.   Past Medical History:  Diagnosis Date   Allergy    GI upset from allergies to fish etc   Anemia    long ago- past hx    Cataract    mild   GERD (gastroesophageal reflux disease)    Hereditary angioedema (HCC)    high IgE levels   Hives    history of- cold exposure related   Hx of small bowel obstruction    due to hereditary andioedema- triggered by salmon    Osteopenia 07/2017   T score -1.9 FRAX 9% / 1.1%    Past Surgical History:  Procedure Laterality Date   COLONOSCOPY  2009   DB    EXPLORATORY LAPAROTOMY     UPPER GASTROINTESTINAL ENDOSCOPY      Prior to Admission medications  Medication Sig Start Date End Date Taking? Authorizing Provider  Cholecalciferol  (EQL VITAMIN D3) 1000 units tablet Take 2 tablets (2,000 Units total) by mouth daily. 2 tabs po daily 10/16/17  Yes Plotnikov, Aleksei V, MD  clobetasol  ointment (TEMOVATE ) 0.05 % Apply 1 Application topically 2 (two) times daily. 01/17/23  Yes Prentiss Riggs A, NP  AUVI-Q  0.3 MG/0.3ML SOAJ injection Inject 0.3 mg into the muscle as needed for anaphylaxis. Use as directed for life-threatening allergic reaction. 08/02/22   Kozlow, Camellia PARAS, MD  cetirizine  (ZYRTEC ) 10 MG tablet Take 1 tablet (10 mg total) by mouth daily as needed for allergies (Can take  na extra dose during flare ups.). 08/02/22   Kozlow, Camellia PARAS, MD  cyclobenzaprine  (FLEXERIL ) 5 MG tablet Take 1 tablet (5 mg total) by mouth 3 (three) times daily as needed for muscle spasms. 07/04/23   Plotnikov, Aleksei V, MD  diphenoxylate -atropine  (LOMOTIL ) 2.5-0.025 MG tablet Take 1 tablet by mouth 4 (four) times daily as needed for diarrhea or loose stools. 01/26/23   Plotnikov, Karlynn GAILS, MD  doxycycline  (VIBRA -TABS) 100 MG tablet Take 1 tablet (100 mg total) by mouth 2 (two) times daily. Patient not taking: Reported on 03/29/2024 11/28/23   Plotnikov, Karlynn GAILS, MD  fluconazole  (DIFLUCAN ) 150 MG tablet Take 1 tablet (150 mg total) by mouth every 3 (three) days. Patient not taking: Reported on 03/29/2024 01/17/23   Prentiss Riggs A, NP  fluticasone  (FLONASE ) 50 MCG/ACT nasal spray Place 2 sprays into both nostrils daily. Patient not taking: Reported on 03/29/2024 04/15/21   Wendee Lynwood HERO, NP  gabapentin (NEURONTIN) 300 MG capsule Take 300 mg by mouth 3 (three) times daily. Patient taking differently: Take 300 mg by mouth as needed. 06/23/23   [provider]  HYDROcodone -acetaminophen  (NORCO/VICODIN) 5-325 MG tablet Take 1 tablet by mouth every 4 (four) hours as needed for severe pain (pain score 7-10). Patient not taking: Reported on 03/29/2024 07/04/23 07/03/24  Plotnikov, Aleksei V, MD  levocetirizine (XYZAL ) 5 MG tablet Take 1 tablet (5 mg  total) by mouth every evening. Patient not taking: Reported on 03/29/2024 04/15/21   Wendee Lynwood HERO, NP  nitroGLYCERIN  (NITRODUR - DOSED IN MG/24 HR) 0.2 mg/hr patch Place 1 patch (0.2 mg total) onto the skin daily. Use 1/4 patch to the affected area Patient not taking: Reported on 03/29/2024 12/21/23   Harvey Seltzer, MD  ondansetron  (ZOFRAN ) 4 MG tablet Take 1 tablet (4 mg total) by mouth every 8 (eight) hours as needed for nausea or vomiting. 01/26/23   Plotnikov, Karlynn GAILS, MD  predniSONE  (DELTASONE ) 10 MG tablet Prednisone  10 mg: take 4 tabs a day x 3  days; then 3 tabs a day x 4 days; then 2 tabs a day x 4 days, then 1 tab a day x 6 days, then stop. Take pc. Patient not taking: Reported on 03/29/2024 07/04/23   Plotnikov, Aleksei V, MD  triamcinolone  cream (KENALOG ) 0.5 % Apply 1 Application topically 3 (three) times daily. Patient taking differently: Apply 1 Application topically as needed. 11/28/23 11/27/24  Plotnikov, Karlynn GAILS, MD    Current Outpatient Medications  Medication Sig Dispense Refill   Cholecalciferol  (EQL VITAMIN D3) 1000 units tablet Take 2 tablets (2,000 Units total) by mouth daily. 2 tabs po daily 100 tablet 3   clobetasol  ointment (TEMOVATE ) 0.05 % Apply 1 Application topically 2 (two) times daily. 30 g 0   AUVI-Q  0.3 MG/0.3ML SOAJ injection Inject 0.3 mg into the muscle as needed for anaphylaxis. Use as directed for life-threatening allergic reaction. 2 each 1   cetirizine  (ZYRTEC ) 10 MG tablet Take 1 tablet (10 mg total) by mouth daily as needed for allergies (Can take na extra dose during flare ups.). 180 tablet 3   cyclobenzaprine  (FLEXERIL ) 5 MG tablet Take 1 tablet (5 mg total) by mouth 3 (three) times daily as needed for muscle spasms. 30 tablet 1   diphenoxylate -atropine  (LOMOTIL ) 2.5-0.025 MG tablet Take 1 tablet by mouth 4 (four) times daily as needed for diarrhea or loose stools. 60 tablet 0   doxycycline  (VIBRA -TABS) 100 MG tablet Take 1 tablet (100 mg total) by mouth 2 (two) times daily. (Patient not taking: Reported on 03/29/2024) 20 tablet 0   fluconazole  (DIFLUCAN ) 150 MG tablet Take 1 tablet (150 mg total) by mouth every 3 (three) days. (Patient not taking: Reported on 03/29/2024) 2 tablet 0   fluticasone  (FLONASE ) 50 MCG/ACT nasal spray Place 2 sprays into both nostrils daily. (Patient not taking: Reported on 03/29/2024) 16 g 0   gabapentin (NEURONTIN) 300 MG capsule Take 300 mg by mouth 3 (three) times daily. (Patient taking differently: Take 300 mg by mouth as needed.)     HYDROcodone -acetaminophen   (NORCO/VICODIN) 5-325 MG tablet Take 1 tablet by mouth every 4 (four) hours as needed for severe pain (pain score 7-10). (Patient not taking: Reported on 03/29/2024) 20 tablet 0   levocetirizine (XYZAL ) 5 MG tablet Take 1 tablet (5 mg total) by mouth every evening. (Patient not taking: Reported on 03/29/2024) 15 tablet 0   nitroGLYCERIN  (NITRODUR - DOSED IN MG/24 HR) 0.2 mg/hr patch Place 1 patch (0.2 mg total) onto the skin daily. Use 1/4 patch to the affected area (Patient not taking: Reported on 03/29/2024) 30 patch 1   ondansetron  (ZOFRAN ) 4 MG tablet Take 1 tablet (4 mg total) by mouth every 8 (eight) hours as needed for nausea or vomiting. 20 tablet 0   predniSONE  (DELTASONE ) 10 MG tablet Prednisone  10 mg: take 4 tabs a day x 3 days; then 3 tabs a day  x 4 days; then 2 tabs a day x 4 days, then 1 tab a day x 6 days, then stop. Take pc. (Patient not taking: Reported on 03/29/2024) 38 tablet 1   triamcinolone  cream (KENALOG ) 0.5 % Apply 1 Application topically 3 (three) times daily. (Patient taking differently: Apply 1 Application topically as needed.) 30 g 1   Current Facility-Administered Medications  Medication Dose Route Frequency Provider Last Rate Last Admin   0.9 %  sodium chloride  infusion  500 mL Intravenous Once Odyn Turko V, MD        Allergies as of 07/03/2024 - Review Complete 07/03/2024  Allergen Reaction Noted   Penicillins Anaphylaxis 09/21/2007    Family History  Problem Relation Age of Onset   Parkinsonism Mother    Colon cancer Mother 50   Macular degeneration Mother    Allergy (severe) Father        hives   Rheum arthritis Father    Allergy (severe) Daughter        hives   Angioedema Daughter        seen by immunologist at [facility]   Urticaria Daughter    Breast cancer Paternal Aunt    Colon polyps Neg Hx    Rectal cancer Neg Hx    Stomach cancer Neg Hx     Social History   Socioeconomic History   Marital status: Married    Spouse name: Not on file    Number of children: Not on file   Years of education: Not on file   Highest education level: Not on file  Occupational History   Occupation: RETIRED  Tobacco Use   Smoking status: Never   Smokeless tobacco: Never  Vaping Use   Vaping status: Never Used  Substance and Sexual Activity   Alcohol use: Yes    Alcohol/week: 0.0 standard drinks of alcohol    Comment: socially    Drug use: No   Sexual activity: Yes    Comment: INTERCOURSE AGE 68, SEXUAL PARTNERS LESS THAN 5  Other Topics Concern   Not on file  Social History Narrative   Lives with husband/2025   Social Drivers of Health   Tobacco Use: Low Risk (07/03/2024)   Patient History    Smoking Tobacco Use: Never    Smokeless Tobacco Use: Never    Passive Exposure: Not on file  Financial Resource Strain: Patient Declined (03/29/2024)   Overall Financial Resource Strain (CARDIA)    Difficulty of Paying Living Expenses: Patient declined  Food Insecurity: Patient Declined (03/29/2024)   Epic    Worried About Programme Researcher, Broadcasting/film/video in the Last Year: Patient declined    Barista in the Last Year: Patient declined  Transportation Needs: No Transportation Needs (03/29/2024)   Epic    Lack of Transportation (Medical): No    Lack of Transportation (Non-Medical): No  Physical Activity: Sufficiently Active (03/29/2024)   Exercise Vital Sign    Days of Exercise per Week: 3 days    Minutes of Exercise per Session: 60 min  Stress: No Stress Concern Present (03/29/2024)   [employer] of Occupational Health - Occupational Stress Questionnaire    Feeling of Stress: Not at all  Social Connections: Socially Integrated (03/29/2024)   Social Connection and Isolation Panel    Frequency of Communication with Friends and Family: More than three times a week    Frequency of Social Gatherings with Friends and Family: Three times a week    Attends Religious Services: More  than 4 times per year    Active Member of Clubs or  Organizations: Yes    Attends Banker Meetings: 1 to 4 times per year    Marital Status: Married  Catering Manager Violence: Not At Risk (03/29/2024)   Epic    Fear of Current or Ex-Partner: No    Emotionally Abused: No    Physically Abused: No    Sexually Abused: No  Depression (PHQ2-9): Low Risk (03/29/2024)   Depression (PHQ2-9)    PHQ-2 Score: 0  Alcohol Screen: Low Risk (03/29/2024)   Alcohol Screen    Last Alcohol Screening Score (AUDIT): 0  Housing: Patient Unable To Answer (03/29/2024)   Epic    Unable to Pay for Housing in the Last Year: Patient unable to answer    Number of Times Moved in the Last Year: Not on file    Homeless in the Last Year: Patient unable to answer  Utilities: Patient Declined (03/29/2024)   Epic    Threatened with loss of utilities: Patient declined  Health Literacy: Adequate Health Literacy (03/29/2024)   B1300 Health Literacy    Frequency of need for help with medical instructions: Never    Review of Systems:  All other review of systems negative except as mentioned in the HPI.  Physical Exam: Vital signs in last 24 hours: BP 129/75   Pulse 65   Temp (!) 97.3 F (36.3 C)   Resp 15   Ht 5' 4 (1.626 m)   Wt 155 lb (70.3 kg)   SpO2 100%   BMI 26.61 kg/m  General:   Alert, NAD Lungs:  Clear .   Heart:  Regular rate and rhythm Abdomen:  Soft, nontender and nondistended. Neuro/Psych:  Alert and cooperative. Normal mood and affect. A and O x 3  Reviewed labs, radiology imaging, old records and pertinent past GI work up  Patient is appropriate for planned procedure(s) and anesthesia in an ambulatory setting   K. Veena Tiarrah Saville , MD [phone]

## 2024-07-04 ENCOUNTER — Telehealth: Payer: Self-pay

## 2024-07-04 NOTE — Telephone Encounter (Signed)
" °  Follow up Call-     07/03/2024    7:33 AM  Call back number  Post procedure Call Back phone  # [phone]  Permission to leave phone message Yes     Patient questions:  Do you have a fever, pain , or abdominal swelling? No. Pain Score  0 *  Have you tolerated food without any problems? Yes.    Have you been able to return to your normal activities? Yes.    Do you have any questions about your discharge instructions: Diet   No. Medications  No. Follow up visit  No.  Do you have questions or concerns about your Care? No.  Actions: * If pain score is 4 or above: No action needed, pain <4.   "

## 2024-07-05 LAB — SURGICAL PATHOLOGY

## 2024-11-27 ENCOUNTER — Ambulatory Visit

## 2024-11-28 ENCOUNTER — Encounter: Admitting: Internal Medicine

## 2025-04-03 ENCOUNTER — Ambulatory Visit
